# Patient Record
Sex: Female | Born: 1955 | Race: White | Hispanic: No | Smoking: Current every day smoker
Health system: Southern US, Community
[De-identification: ages and names within clinical notes are randomized; demographics above are authoritative.]

## PROBLEM LIST (undated history)

## (undated) DIAGNOSIS — Z860100 Personal history of colon polyps, unspecified: Secondary | ICD-10-CM

## (undated) DIAGNOSIS — J45909 Unspecified asthma, uncomplicated: Secondary | ICD-10-CM

## (undated) DIAGNOSIS — M1990 Unspecified osteoarthritis, unspecified site: Secondary | ICD-10-CM

## (undated) DIAGNOSIS — R928 Other abnormal and inconclusive findings on diagnostic imaging of breast: Secondary | ICD-10-CM

## (undated) DIAGNOSIS — H269 Unspecified cataract: Secondary | ICD-10-CM

## (undated) DIAGNOSIS — F32A Depression, unspecified: Secondary | ICD-10-CM

## (undated) DIAGNOSIS — F1020 Alcohol dependence, uncomplicated: Secondary | ICD-10-CM

## (undated) DIAGNOSIS — K759 Inflammatory liver disease, unspecified: Secondary | ICD-10-CM

## (undated) DIAGNOSIS — F329 Major depressive disorder, single episode, unspecified: Secondary | ICD-10-CM

## (undated) DIAGNOSIS — T7840XA Allergy, unspecified, initial encounter: Secondary | ICD-10-CM

## (undated) DIAGNOSIS — N63 Unspecified lump in unspecified breast: Secondary | ICD-10-CM

## (undated) DIAGNOSIS — F419 Anxiety disorder, unspecified: Secondary | ICD-10-CM

## (undated) DIAGNOSIS — F319 Bipolar disorder, unspecified: Secondary | ICD-10-CM

## (undated) DIAGNOSIS — E785 Hyperlipidemia, unspecified: Secondary | ICD-10-CM

## (undated) HISTORY — DX: Unspecified cataract: H26.9

## (undated) HISTORY — DX: Alcohol dependence, uncomplicated: F10.20

## (undated) HISTORY — DX: Personal history of colonic polyps: Z86.010

## (undated) HISTORY — DX: Allergy, unspecified, initial encounter: T78.40XA

## (undated) HISTORY — PX: PILONIDAL CYST EXCISION: SHX744

## (undated) HISTORY — PX: TUBAL LIGATION: SHX77

## (undated) HISTORY — PX: TONSILLECTOMY: SUR1361

## (undated) HISTORY — PX: JOINT REPLACEMENT: SHX530

## (undated) HISTORY — PX: HIP FRACTURE SURGERY: SHX118

## (undated) HISTORY — PX: BACK SURGERY: SHX140

## (undated) HISTORY — DX: Personal history of colon polyps, unspecified: Z86.0100

## (undated) HISTORY — PX: CHOLECYSTECTOMY: SHX55

---

## 1998-02-21 ENCOUNTER — Ambulatory Visit (HOSPITAL_COMMUNITY): Admission: RE | Admit: 1998-02-21 | Discharge: 1998-02-21 | Payer: Self-pay | Admitting: Family Medicine

## 1998-02-21 ENCOUNTER — Encounter: Payer: Self-pay | Admitting: Family Medicine

## 1998-03-09 ENCOUNTER — Encounter: Admission: RE | Admit: 1998-03-09 | Discharge: 1998-06-07 | Payer: Self-pay | Admitting: Neurosurgery

## 1999-10-28 ENCOUNTER — Emergency Department (HOSPITAL_COMMUNITY): Admission: EM | Admit: 1999-10-28 | Discharge: 1999-10-28 | Payer: Self-pay | Admitting: Emergency Medicine

## 1999-11-23 ENCOUNTER — Ambulatory Visit (HOSPITAL_COMMUNITY): Admission: RE | Admit: 1999-11-23 | Discharge: 1999-11-23 | Payer: Self-pay | Admitting: Neurosurgery

## 2000-01-01 ENCOUNTER — Ambulatory Visit (HOSPITAL_COMMUNITY): Admission: RE | Admit: 2000-01-01 | Discharge: 2000-01-01 | Payer: Self-pay | Admitting: Neurosurgery

## 2000-01-22 ENCOUNTER — Ambulatory Visit (HOSPITAL_COMMUNITY): Admission: RE | Admit: 2000-01-22 | Discharge: 2000-01-22 | Payer: Self-pay | Admitting: Neurosurgery

## 2000-02-05 ENCOUNTER — Ambulatory Visit (HOSPITAL_COMMUNITY): Admission: RE | Admit: 2000-02-05 | Discharge: 2000-02-05 | Payer: Self-pay | Admitting: Neurosurgery

## 2001-04-07 ENCOUNTER — Encounter: Payer: Self-pay | Admitting: Family Medicine

## 2001-04-07 ENCOUNTER — Ambulatory Visit: Admission: RE | Admit: 2001-04-07 | Discharge: 2001-04-07 | Payer: Self-pay | Admitting: Family Medicine

## 2001-04-16 ENCOUNTER — Ambulatory Visit (HOSPITAL_COMMUNITY): Admission: RE | Admit: 2001-04-16 | Discharge: 2001-04-16 | Payer: Self-pay | Admitting: Gastroenterology

## 2001-06-15 ENCOUNTER — Encounter: Admission: RE | Admit: 2001-06-15 | Discharge: 2001-06-15 | Payer: Self-pay | Admitting: Neurosurgery

## 2001-07-05 ENCOUNTER — Inpatient Hospital Stay (HOSPITAL_COMMUNITY): Admission: RE | Admit: 2001-07-05 | Discharge: 2001-07-11 | Payer: Self-pay | Admitting: Neurosurgery

## 2002-01-19 ENCOUNTER — Other Ambulatory Visit: Admission: RE | Admit: 2002-01-19 | Discharge: 2002-01-19 | Payer: Self-pay | Admitting: Family Medicine

## 2002-01-26 ENCOUNTER — Encounter: Payer: Self-pay | Admitting: Family Medicine

## 2002-01-26 ENCOUNTER — Ambulatory Visit (HOSPITAL_COMMUNITY): Admission: RE | Admit: 2002-01-26 | Discharge: 2002-01-26 | Payer: Self-pay | Admitting: Family Medicine

## 2002-02-07 ENCOUNTER — Encounter: Admission: RE | Admit: 2002-02-07 | Discharge: 2002-03-07 | Payer: Self-pay | Admitting: Neurosurgery

## 2002-02-09 ENCOUNTER — Ambulatory Visit (HOSPITAL_COMMUNITY): Admission: RE | Admit: 2002-02-09 | Discharge: 2002-02-09 | Payer: Self-pay | Admitting: Neurosurgery

## 2002-03-24 ENCOUNTER — Encounter: Admission: RE | Admit: 2002-03-24 | Discharge: 2002-06-22 | Payer: Self-pay

## 2002-08-11 ENCOUNTER — Encounter
Admission: RE | Admit: 2002-08-11 | Discharge: 2002-11-09 | Payer: Self-pay | Admitting: Physical Medicine & Rehabilitation

## 2002-11-18 ENCOUNTER — Encounter: Admission: RE | Admit: 2002-11-18 | Discharge: 2002-11-18 | Payer: Self-pay | Admitting: Orthopedic Surgery

## 2002-11-18 ENCOUNTER — Encounter: Payer: Self-pay | Admitting: Orthopedic Surgery

## 2003-02-02 ENCOUNTER — Ambulatory Visit (HOSPITAL_COMMUNITY): Admission: RE | Admit: 2003-02-02 | Discharge: 2003-02-02 | Payer: Self-pay | Admitting: Obstetrics and Gynecology

## 2003-03-03 ENCOUNTER — Encounter: Admission: RE | Admit: 2003-03-03 | Discharge: 2003-03-03 | Payer: Self-pay | Admitting: Orthopedic Surgery

## 2003-07-17 ENCOUNTER — Inpatient Hospital Stay (HOSPITAL_COMMUNITY): Admission: RE | Admit: 2003-07-17 | Discharge: 2003-07-20 | Payer: Self-pay | Admitting: Orthopedic Surgery

## 2003-09-18 ENCOUNTER — Encounter: Admission: RE | Admit: 2003-09-18 | Discharge: 2003-12-17 | Payer: Self-pay | Admitting: Orthopedic Surgery

## 2003-10-14 ENCOUNTER — Emergency Department (HOSPITAL_COMMUNITY): Admission: EM | Admit: 2003-10-14 | Discharge: 2003-10-14 | Payer: Self-pay | Admitting: Emergency Medicine

## 2004-01-03 ENCOUNTER — Ambulatory Visit (HOSPITAL_COMMUNITY): Admission: RE | Admit: 2004-01-03 | Discharge: 2004-01-03 | Payer: Self-pay | Admitting: Neurosurgery

## 2004-02-27 ENCOUNTER — Ambulatory Visit (HOSPITAL_COMMUNITY): Admission: RE | Admit: 2004-02-27 | Discharge: 2004-02-27 | Payer: Self-pay | Admitting: Gastroenterology

## 2006-06-24 ENCOUNTER — Encounter: Admission: RE | Admit: 2006-06-24 | Discharge: 2006-06-24 | Payer: Self-pay | Admitting: Neurosurgery

## 2007-09-21 ENCOUNTER — Ambulatory Visit (HOSPITAL_COMMUNITY): Admission: RE | Admit: 2007-09-21 | Discharge: 2007-09-21 | Payer: Self-pay | Admitting: Obstetrics and Gynecology

## 2007-10-06 ENCOUNTER — Other Ambulatory Visit: Admission: RE | Admit: 2007-10-06 | Discharge: 2007-10-06 | Payer: Self-pay | Admitting: Obstetrics and Gynecology

## 2010-03-17 ENCOUNTER — Encounter: Payer: Self-pay | Admitting: Obstetrics and Gynecology

## 2010-03-17 ENCOUNTER — Encounter: Payer: Self-pay | Admitting: Family Medicine

## 2010-03-25 ENCOUNTER — Other Ambulatory Visit: Payer: Self-pay | Admitting: Obstetrics and Gynecology

## 2010-03-25 DIAGNOSIS — Z139 Encounter for screening, unspecified: Secondary | ICD-10-CM

## 2010-04-01 ENCOUNTER — Ambulatory Visit (HOSPITAL_COMMUNITY): Payer: Medicare Other

## 2010-07-12 NOTE — Discharge Summary (Signed)
[name], [name]                          ACCOUNT NO.:  [id number]   MEDICAL RECORD NO.:  [id number]                   PATIENT TYPE:  INP   LOCATION:  5035                                 FACILITY:  [facility]   PHYSICIAN:  Mila Homer. Sherlean Foot, M.D.              DATE OF BIRTH:  09/19/1955   DATE OF ADMISSION:  07/17/2003  DATE OF DISCHARGE:  07/20/2003                                 DISCHARGE SUMMARY   ADMITTING DIAGNOSES:  1. Avascular necrosis, right hip.  2. Seasonal allergies.  3. Low back pain with a history of fusion at multiple levels.   DISCHARGE DIAGNOSES:  1. Status post right total hip arthroplasty.  2. Acute blood loss anemia secondary to surgery.  3. Leukocytosis, resolved.  4. Seasonal allergies.  5. Lumbar fusion, chronic lumbar pain.   HISTORY OF PRESENT ILLNESS:  [name] is a 55 year old white female with a 2-  to 3-year history of right hip pain.  Pain described as a constant, dull,  aching pain with no radiation.  Mechanical symptoms positive for giving way  of the leg at times.  The patient has difficulty falling asleep and wakes up  during the night due to the right hip pain.  She gets pain relief with  Demerol and Darvocet.  She has tried cortisone injections in the hip which  give her pain relief for approximately 3-4 months.  However, her last hip  injection gave her relief from the pain for only 5 days.  The patient's pain  is worse with standing, walking, getting up from a sitting position.  Lying  down on it does help her pain.  The patient uses a walker at times to  ambulate.  X-rays of the right hip shows avascular necrosis.  AP pelvis was  performed in the office shows AVN of right hip.  MRI of the pelvis shows AVN  of both hips.  The patient therefore was admitted to [facility] on  Jul 17, 2003 to undergo a right total hip replacement.   ALLERGIES:  1. CODEINE causes nausea/vomiting.  2. VICODIN causes nausea/vomiting.   MEDICATIONS:   Valium b.i.d. to t.i.d. daily p.r.n., Demerol b.i.d. to t.i.d.  daily p.r.n.,  Darvocet b.i.d. to t.i.d. daily p.r.n. - dosage question, and  Allegra D b.i.d.   SURGICAL PROCEDURE:  The patient was taken to the operating room on Jul 17, 2003 by Dr. Georgena Spurling, assisted by Arnoldo Morale, P.A.-C.  The patient was  placed under general anesthesia and the right total hip replacement was  performed.  The following components were used:  Size 11 fully porous coated  stem, 32 mm + 35 head onto a mortise taper.  The patient tolerated the  procedure well and returned to recovery in good, stable condition.   CONSULTS:  The following consults were obtained:  PT, OT, rehab, case  management.   HOSPITAL COURSE:  Postoperatively the patient developed leukocytosis.  However, this was resolved at the time of discharge and the patient was  afebrile.  The patient also had developed acute blood loss anemia secondary  to surgery.  However, remained asymptomatic and did not require any blood  transfusions.  The patient was discharged home on postoperative day #3 in a  good, stable condition.  Tmax at the time was 100.3.  Vital signs otherwise  were stable.   CBC on admission all values were within normal limits.  At the time of  discharge hemoglobin 9.6, hematocrit 27.3.  Coags on admission all within  normal limits on admission.  Routine chemistries on admission all within  normal limits except for glucose which was slightly elevated at 101.  Hepatic enzymes on admission were all within normal limits.  UA on admission  was negative.   X-rays:  AP pelvis performed on Jul 17, 2003 postoperatively showed good AP  position of the right total hip.   DISCHARGE INSTRUCTIONS:  1. The patient was to continue home medications except for Demerol and     Darvocet.  2. Add the following medications:     a. Lovenox 40 mg one injection daily at 8 a.m. for 11 days.     b. Robaxin 500 mg one tablet q.6-8h. as  needed for muscle spasm.     c. Percocet 5 mg one to two tablets q.4-6h. as needed for pain.  3. Activity:  The patient is to be weightbearing as tolerated with walker.     Home health PT with [facility].  4. Wound care:  The patient is to keep wound clean and dry.  May shower     after 2 days with no drainage.  5. The patient is to call office with temperature greater than 101.5,     chills, swelling, foul-smelling drainage, or pain not controlled.  6. Follow-up:  The patient needs follow-up with Dr. Sherlean Foot in the office     approximately 11 days from discharge.  The patient is to call office at     [phone] to make an appointment.   CONDITION ON DISCHARGE:  The patient was discharged home in good stable  condition.      Richardean Canal, P.A.                       Mila Homer. Sherlean Foot, M.D.    GC/MEDQ  D:  08/18/2003  T:  08/19/2003  Job:  [id number]

## 2010-07-12 NOTE — Consult Note (Signed)
NAME:  [name], [name]                          ACCOUNT NO.:  [id number]   MEDICAL RECORD NO.:  [id number]                   PATIENT TYPE:  REC   LOCATION:  TPC                                  FACILITY:  [facility]   PHYSICIAN:  Zachary George, DO                      DATE OF BIRTH:  03/12/1955   DATE OF CONSULTATION:  03/25/2002  DATE OF DISCHARGE:                                   CONSULTATION   REFERRING PHYSICIAN:  Mila Homer. Sherlean Foot, M.D.   Dear Dr. Sherlean Foot,  Thank you very much for kindly referring [name].  Ms. Cadogan was evaluated in  our clinic.  Please refer to the following for details regarding history,  physical, examination, and treatment plan.  Once again, thank you for  allowing [place] to participate in the care of [name].   CHIEF COMPLAINT:  Bilateral hip pain.   HISTORY OF PRESENT ILLNESS:  [name] is a pleasant 55 year old right-hand  dominant female who has been diagnosed with avascular necrosis of the hips  bilaterally.  She is a kind referral from Dr. Sherlean Foot for pain management.  The patient states that she has had a long history of low back pain for  approximately 20 years with disk problems and pain radiating into her left  lower extremity.  She also has had a 10 year history of bilateral hip pain,  right greater then left.  She had thought that the pain was mainly coming  from her back, and underwent lumbar surgery on 07/05/01, per Dr. Tressie Stalker, which she states helped her low back pain and lower extremity  radicular symptoms significantly, but did not help her hip pain.  Further  workup included MRI of her hips bilaterally which revealed avascular  necrosis bilaterally.  She has had some steroid injections into her back  previously, and has had oral doses of steroids for poison ivy throughout her  life, but she has not been on chronic steroids for any length of time.  She  does state that  her mother had avascular necrosis in her hips as well.  She  was referred by Dr. Lovell Sheehan to Dr. Sherlean Foot who recommended pain management  with eventual total hip arthroplasty.  She rates her pain today as a 5/10 on  a subjective scale, and describes it as moderate.  She describes her pain  as constant, dull, achy, with associated feeling of weakness in her lower  extremities.  Symptoms are worse with walking, bending, sitting, standing,  working, and improved with rest, and some degree with medications which  currently include Darvocet-N 100  q.6-8h. with minimal improvement.  She has  tried various medications, including Celebrex, Vioxx, Bextra, which causes  her to break-out, and did not really help her pain.  She has taken Ultram  in  the past which offered no significant relief.  She has also tried  hydrocodone in the past with associated nausea.  She does not believe she  has taken oxycodone in the past.  Her function and quality of life indices  have declined, and she notes limited activities secondary to hip pain.  She  is unable to walk long distances.  Her sleep is fair.  I reviewed health and  history form and 14 point review of systems.  She admits to unexplained  fatigue, sinus troubles, nose bleeds, ankle swelling on occasion, frequent  urination, arthritis, back pain, and feeling of depression secondary to her  pain complaints.   PAST MEDICAL HISTORY:  1. Depression.  2. Chronic low back pain.  3. Neck pain for which she is currently being followed by Dr. Lovell Sheehan.   PAST SURGICAL HISTORY:  1. Tonsillectomy.  2. Tubal ligation.  3. Pilonidal cyst.  4. Lumbar surgery, including L4-5 fusion.   FAMILY HISTORY:  Diabetes.   SOCIAL HISTORY:  The patient smokes 1/2 pack of cigarettes per day, and I  counseled her on the importance of smoking cessation in terms of pain and  overall health, and she states that she is trying to quit.  Alcohol is  occasional.  She denies drug use  or history of alcohol or drug abuse.  She  is divorced, and is not currently working, as she is applying for  disability.  She is trained as a Sports administrator.   ALLERGIES:  1. CODEINE.  2. NON-STEROIDAL ANTI-INFLAMMATORY MEDICATIONS.   MEDICATIONS:  1. Dar q.6-8h. p.r.n.  2. Valium 5 mg one or two daily p.r.n.  3. Allegra.  4. Effexor.   PHYSICAL EXAMINATION:  GENERAL:  A healthy female in no acute distress.  The  patient is alert and oriented.  Mood and affect are appropriate.  VITAL SIGNS:  Blood pressure is 144/75, pulse is 80, respirations 20, O2  saturation 97% on room air.  HEENT:  Unremarkable.  HEART:  Regular rate and rhythm without murmurs, rubs, or gallops.  LUNGS:  Clear to auscultation bilaterally.  ABDOMEN:  Soft, nontender, diminished bowel sounds throughout.  BACK:  Spine examination reveals decreased lumbar lordosis with level  pelvis, no scoliosis, and a healed vertical midline incisional scar.  There  is normal thoracic kyphosis and cervical lordosis.  Range of motion of the  cervical spine is full.  Range of motion of the lumbar spine is decreased in  all planes with mild discomfort on flexion.  Manual muscle testing is 5/5  bilateral upper and lower extremities on all muscle groups tested.  Sensory  examination is intact to light touch bilateral upper and lower extremities  with the exception of a small area in the left lateral thigh which is  decreased to light touch.  Muscle stretch reflexes are 2+/4 bilateral  biceps, triceps, brachial radialis, pronator teres, patellae, medial  hamstrings, and Achilles.  Spurling maneuver is negative bilaterally.  Straight leg raise is negative bilaterally.  Pearlean Brownie test is positive,  reproducing groin pain bilaterally.  Range of motion of the hips is  decreased bilaterally with pain, especially with internal rotation bilaterally, right greater then left.  There is no heat, erythema, or edema  in the lower extremities at  this time.  No abnormal tone noted in the lower  extremities.   IMPRESSION:  1. Avascular necrosis of bilateral hips with chronic hip pain.  2. Degenerative disk disease of the lumbar spine, status  post L4-5 fusion.  3. 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  No     clinical evidence of cervical radiculopathy at this time.   PLAN:  1. I discussed treatment options with Ms. Werst, to include medication     management.  We discussed non-narcotic and narcotic based pain     medications to decrease her pain and improve her functional abilities.     In general, Ms. Vallo would like to get off of some of her medications,     and we discussed this at length.  Initially, I would like to give her a     trial of Ultracet one or two p.o. t.i.d. p.r.n. for pain, and I have     provided her with 20 sample pills as well as a prescription for 100     pills.  If this is not helpful for her, would consider starting a low-     dose opiate pain medication such as oxycodone, as the patient has not     tolerated hydrocodone previously.  If she does not tolerate the     oxycodone, would consider a trial of methadone or even starting a     sustained release preparation such as MS Contin or Duragesic.  2. I would like her to get involved in an aquatic therapy program, and she     has been considering joining the [facility], and I encouraged her to do so.     The medications and therapy combined hopefully will allow her to become     more functional.  3. The patient is to return in two weeks for re-evaluation.   The patient was educated on the above findings and recommendations and  understands.  There were no barriers to communication.                                                Zachary George, DO    JW/MEDQ  D:  03/25/2002  T:  03/25/2002  Job:  [id number]   cc:   Mila Homer. Sherlean Foot, M.D.  [address] [place]  [place] [address]  Fax: [phone]   Cristi Loron, M.D.  [address]  [place]  [place] [address]  Fax: [phone]

## 2010-07-12 NOTE — Op Note (Signed)
[name], [name]                          ACCOUNT NO.:  [id number]   MEDICAL RECORD NO.:  [id number]                   PATIENT TYPE:  INP   LOCATION:  5035                                 FACILITY:  [facility]   PHYSICIAN:  Mila Homer. Sherlean Foot, M.D.              DATE OF BIRTH:  1955-10-03   DATE OF PROCEDURE:  07/17/2003  DATE OF DISCHARGE:                                 OPERATIVE REPORT   SURGEON:  Mila Homer. Sherlean Foot, M.D.   ASSISTANT:  Legrand Pitts. Duffy, P.A.   ANESTHESIA:  General.   PREOPERATIVE DIAGNOSIS:  Right hip osteoarthritis and avascular necrosis.   POSTOPERATIVE DIAGNOSIS:  Right hip osteoarthritis and avascular necrosis.   PROCEDURE:  Right total hip arthroplasty.   INDICATION FOR PROCEDURE:  The patient is a 55 year old white female with  failure of conservative measures for AVN of the hip.  Informed consent was  obtained.   DESCRIPTION OF PROCEDURE:  The patient was laid supine and administered  general anesthesia and Foley catheter placement.  I then placed her in the  left hip down/right hip up lateral decubitus position with bony prominences  well padded.  I then prepped and draped the right hip in usual sterile  fashion.  I then made a minimally invasive incision 10 cm in length.  I went  down through the skin and subcutaneous tissue to the fascia lata.  I  obtained hemostasis with the cautery.  I then incised the fascia lata along  the length of the incision and placed a Charnley retractor in place.  I then  removed the anterior 1/3 of the gluteus minimis and elevated that off the  hip capsule and tagged it with three stay sutures.  I then did an anterior  hip capsulectomy.  I then externally rotated the leg in extension and used a  cutting guide to mark out my femoral neck cut.  I then made a similar neck  cut with a reciprocating saw and removed that with a Steinmann pin.  I then  placed a Homan retractor anterior and posterior to the acetabulum and  removed  the labrum circumferentially.  I then switched sides of the table  with my PA and reamed progressively from 44 mm to 50 mm with the reamers.  I  then tamped in a no hole, no spike, 52-mm fiber mesh cup.  This was very,  very stable.  I then placed in a provisional 7-mm offset 0 trial liner  accepting a 32-mm head.  I then switched sides of the table back to the back  side of the patient and flexed the leg off into a sterile pouch.  I then  used a canal finder to find the intramedullary canal through the cut surface  of the femoral neck.  I used a side biting reamer to ream into the  trochanter.  I then reamed progressively up to a size 11  reamer.  I then  broached with a size 10 and then a size 11 reamer and had good rotational  stability.  I then trialed with a size 0 and +35 32-mm heads.  The 32-mm +35  head gave the best stability and leg length.  It was very, very stable.  I  then dislocated the hip and exposed the acetabulum.  After removing the  femoral component, I tamped in the real 7-mm offset 0 acetabular lining  accepting a 32-mm head.  I then irrigated and tamped down a size 11 fully  porous-coated stem and then tamped on a  +35, 32-mm head onto a _________ Lattie Corns taper and located the hip.  I took  the hip through an extreme range of motion.  It was quite stable.  I then  irrigated and closed the abductor sleeve down through drill holes into the  trochanter.  I then over-sewed this with figure-of-eight #2 Ethibond  sutures.  I then irrigated again and closed the fascia lata with interrupted  #1 figure-of-eight Vicryl sutures, the deep soft tissues with interrupted 0  Vicryl sutures, and then a subcuticular 2-0 Vicryl stitch and skin staples.  I dressed with Adaptic, 4 x 4s, ABD, and sterile Ioban drape.   COMPLICATIONS:  None.   DRAINS:  None.   ESTIMATED BLOOD LOSS:  300 cubic centimeters.                                               Mila Homer. Sherlean Foot, M.D.     SDL/MEDQ  D:  07/18/2003  T:  07/19/2003  Job:  [id number]

## 2010-07-12 NOTE — Discharge Summary (Signed)
[facility]  Patient:    [name], [name] Visit Number: [id number] MRN: [id number]          Service Type: SUR Location: [id number] Attending Physician:  Cristi Loron Dictated by:   Stefani Dama, M.D. 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  DISCHARGE DIAGNOSES: 1. Lumbar spondylosis, herniated nucleus pulposus with degenerative    spondylolisthesis. 2. Postoperative pulmonary infiltrates and fever.  CONDITION ON DISCHARGE:  Improved.  HOSPITAL COURSE:  The patient is a 55 year old individual who has had significant problems with back pain for a prolonged period of time.  Having failed extensive efforts at conservative therapy, she was admitted on 12th where she underwent a lumbar laminectomy, diskectomy, posterior interbody arthrodesis at the L4-5 and the L5-S1 levels.  She tolerated the surgery well, however, during the postoperative period, for the first four days, she had significant fever to 102 degrees; it was felt to be secondary to pulmonary toilet.  A chest x-ray demonstrated that she had bibasilar infiltrates.  The patient was mobilized.  She was given incentive spirometry and gradually her fevers resolved; chest x-ray changes, however, persisted.  She has, however, at this point had 48 hours of no fevers; she has, however, developed 3+ pitting edema of both ankles.  At this time it was felt that she has some fluid overload and she is sent home with an additional diuretic of hydrochlorothiazide 25 mg a day for the next three days time.  Her incision is clean and dry.  Her pain is well-controlled with oral Demerol and she is also given a prescription for Valium for home pain control.  FOLLOWUP:  She will be seen by Dr. Cristi Loron in the next week to  plan for further followup. Dictated by:   Stefani Dama, M.D. Attending Physician:  Tressie Stalker D DD:  07/11/01 TD:  07/13/01 Job: [id number]

## 2010-07-12 NOTE — Op Note (Signed)
[place]  Patient:    [name], TOWE Visit Number: [id number] MRN: [id number]          Service Type: SUR Location: [facility] [id number] 06 Attending Physician:  Cristi Loron Dictated by:   Cristi Loron, M.D. Proc. Date: 07/05/01 Admit Date:  07/05/2001                             Operative Report  INDICATIONS:  The patient is a 55 year old white female who has suffered from back and leg pain.  She failed extensive medical management, including time, medications, physical therapy, injections, etc.  I therefore sent her for a lumbar discogram.  It demonstrated concordant pain at L4-5 and L5-S1.  I therefore discussed a lumbar fusion with the patient.  She weighed the risks, benefits, and alternatives of surgery, and decided to proceed with the operation.  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  OPERATION PERFORMED 1. L4-5 and L5-S1 posterior lumbar interbody fusion. 2. Placement of bilateral tangent 10 mm x 26 mm bone dowels at L4-5 and    L5-S1. 3. Posterior segmental instrumentation at L4-5 and L5-S1, with CD Horizon    MM10 titanium pedicle screws and rods. 4. Posterior arthrodesis with Vitoss bone scaffolding and local    morcellized autograft bone.  SURGEON:  Cristi Loron, M.D.  ASSISTANT:  Hewitt Shorts, M.D.  ANESTHESIA:  General endotracheal.  ESTIMATED BLOOD LOSS:  400 cc.  SPECIMENS:  None.  DRAIN:  None.  COMPLICATIONS:  None.  DESCRIPTION OF PROCEDURE:  The patient was brought to the operating room by the anesthesia team.  General endotracheal anesthesia was induced.  The patient was then turned to the prone position on the chest rolls.  The lumbosacral region was then prepared with Betadine scrub and Betadine solution.  Sterile  drapes were applied.  I then injected the _________ size with Marcaine with epinephrine solution, and used the scalpel to make a linear midline incision over the L4-L5 and L5-S1 interspace.  I used electrocautery to dissect down to the thoracolumbar fascia, and then divide the fascia bilaterally, performing a bilateral subperiosteal dissection, stripping the paraspinous muscles from their bilateral spinous process and lamina of L4, L5, and the upper sacrum.  I inserted a McCullough retractor for exposure.  Then I obtained intraoperative radiographs, to confirm the location.  I then used the high-speed drill to perform bilateral L4 and L5 laminotomies.  I widened the laminotomies with the Kerrison punch and removed the bilateral L4-5 and L5-S1 ligament of flavum.  I then freed up the thecal sac and the bilateral L5 and S1 nerve roots and epidural tissues.  Then I performed an aggressive bilateral diskectomy at L4-5 and L5-S1 using the pituitary forceps, Epstein and ________ curets.  I prepared the vertebral bodies at L4-5 and L5-S1 for the fusion, using the cutting curets and the chisel, preparing the intervertebral end plates bilaterally.  I then carefully distracted the interspace with the 10 mm spreader, and then placed a 10 mm x 26 mm tented bone dowel bilaterally at L4-5 and L5-S1.  I packed in between the bone dowels with Vitoss bone substitutes and local morcellized autograft bone, completing the posterior lumbar interbody fusion.  I now turned my attention to the posterior segmental instrumentation.  I used electrocautery to  expose the bilateral transverse process at L4-L5.  I then under fluoroscopic guidance decorticated posterior to the bilateral L4, L5, and the S1 pedicles.  I cannulated the pedicles with the pedicle probe.  I tapped the pedicles with the 5.5 mm tap.  I inserted 6.5 mm x 45.0 mm pedicle screws bilaterally at L4, L5, and 6.5 mm x 40 mm pedicle screws bilaterally  at S1, again under fluoroscopic guidance.  Prior to placing the screws, I cannulated the tapped pedicle and noted that the cortex would not breech, and after placing the pedicles, I felt along the medial aspect of the pedicle, and made sure that it was not compressing either L4, L5, or S1 nerve root.  I then connected the unilateral pedicles with a lordotic 70 mm titanium rod and then secured it in place with the appropriate cap on each pedicle screw.  I then incised the L5-S1 and L4-5 interspinous ligaments and ruled the L5 spinous process in order to place the cross-connector.  I used the appropriate length cross-connector to connect the two rods and then secured the cross-connector using the appropriate tighteners/bolts.  I now terminated the instrumentation and did a posterior lateral arthodesis. I used the high-speed drill to decorticate the remainder of the L4-5 and L5-S1 facet joint in the pars region, as well as the transverse process bilaterally at L4 and L5.  I then laid a combination of local morcellized autograft and Vitoss bone scaffolding over the decorticated posterior lateral structures, completing a posterolateral arthrodesis.  I then inspected the thecal sac bilaterally at L4-5 and L5-S1, as well as the exit route of the L5 and S1 nerve roots bilaterally, and noted that the neural structures were well decompressed.  I achieved strenuous hemostasis using bipolar electrocautery. I irrigated the wound out with bacitracin solution, and removed the solution, and then removed the McCullough retractor, and then reapproximated the patients thoracolumbar fascia with interrupted #1 Vicryl suture, the subcutaneous tissue with interrupted #2-0 Vicryl suture, and the skin with Steri-Strips with Benzoin.  The wound was then coated with bacitracin ointment and sterile dressings were applied.  The drapes were removed.  The patient was returned to the supine position, where she was  extubated by the anesthesia team, and transported to the postanesthesia care unit in stable condition.  All sponge, instrument, and needle counts were correct at the end  of this case. Dictated by:   Cristi Loron, M.D. Attending Physician:  Tressie Stalker D DD:  07/05/01 TD:  07/05/01 Job: [id number]

## 2010-07-12 NOTE — H&P (Signed)
[name] Peters, [name] Peters                          ACCOUNT NO.:  [id number]   MEDICAL RECORD NO.:  [id number]                   PATIENT TYPE:  INP   LOCATION:                                       FACILITY:  [facility]   PHYSICIAN:  [name] Peters. [name] Peters, M.D.              DATE OF BIRTH:  1956/01/24   DATE OF ADMISSION:  07/17/2003  DATE OF DISCHARGE:                                HISTORY & PHYSICAL   CHIEF COMPLAINT:  Right hip pain.   HISTORY OF PRESENT ILLNESS:  [name] Peters is 55 year old white female with a two  to three-year history of right hip pain.  She describes the pain as a  constant dull aching pain with no radiation.  Mechanical symptoms are the  cause of her leg giving way at times.  She has difficulty falling asleep at  night and wakes up due to the right hip pain.  She takes Demerol and  Darvocet for pain with some relief.  She tried cortisone injections into the  right hip which has given her three to four months of pain relief at most.  According to Dr. Tobin Peters note, the patient had five days of pain-free after  she received her last right hip injection.  The patient's pain is worse with  standing, walking, and getting up from a sitting position with steps.  Laying down helps her pain.  She uses a walker at times to ambulate.  Her  right hip shows avascular necrosis.  AP pelvis performed in the office shows  AVN of the right hip.  MRI of the pelvis shows AVN of both hips.  The  patient informed by Dr. Sherlean Peters at the last visit on Jul 04, 2003, about  options.  He has decided that we will proceed with a total joint replacement  of the right hip.  The patient is to be admitted to [facility] on  Jul 17, 2003, and undergo a right total arthroplasty.   ALLERGIES:  1. CODEINE causes nausea and vomiting.  2. VICODIN causes nausea and vomiting.   MEDICATIONS:  1. Valium b.i.d. to t.i.d. daily p.r.n.  Question dosage.  2. Demerol b.i.d. to t.i.d. daily p.r.n.  Question dosage.  3. Darvocet b.i.d. to t.i.d. daily p.r.n.  Question dosage.  4. Allegra D b.i.d.   PAST MEDICAL HISTORY:  Positive for:  1. Seasonal allergies.  2. Lumbar fusion.  3. Chronic lumbar pain.   The patient denies any diabetes mellitus, coronary artery disease,  hypertension, thyroid disease, or anemia.   PAST SURGICAL HISTORY:  1. Tonsillectomy.  2. Tubal ligation in 1998.  3. Pilonidal cyst in 1997.  4. Back surgery and fusion in May of 2003.   The patient denies any complications with the anesthesia with any of the  above procedures.  She has not required any blood transfusions in the past.  She did have a fever of  unknown origin with her back surgery in May of 2003,  resolved.   SOCIAL HISTORY:  The patient recently quit smoking approximately one month  ago.  She denies any alcohol use.  She is divorced.  She has one female child  who lives with her.  Her primary care physician is [name] Peters, M.D., in  [place], [place].  The patient lives in a one-story home with five  steps to the usual entrance.  The patient is temporarily disabled.   FAMILY HISTORY:  The patient's mother is alive and has known irregular  heartbeat, question atrial fibrillation, on Coumadin.  She has peripheral  edema and urinary incontinence.  The patient's father is deceased of  questionable MI.  He had known diabetes mellitus and colon cancer.  The  patient's brother is alive and has a history of colon cancer.  She has two  sisters, both older, with no known health problems.   REVIEW OF SYSTEMS:  The patient denies any recent cold, cough, fever, or flu-  like symptoms.  She denies any recent cold, cough, fever, or flu-like  symptoms.  She denies any chest pain, shortness of breath, orthopnea, or  PND.  She has neck pain and lower lumbar pain with some nerve damage that  causes pain down her left leg.  She wears glasses.  She denies any GI or GU  medical problems.  She denies any anemia.  She  denies any hematologic or  hepatic disease.  The patient does not have a living will nor does she have  a power-of-attorney.   PHYSICAL EXAMINATION:  GENERAL APPEARANCE:  The patient is a well-developed,  well-nourished, overweight female.  She walks with a slow antalgic gait with  a slight limp on the right.  The patient's mood and affect are appropriate.  She talks easily with the examiner.  HEIGHT:  5 feet 6-1/2 inches.  WEIGHT:  Approximately 180 pounds.  VITAL SIGNS:  Temperature 97.6 degrees, blood pressure 110/64, pulse 70,  respiratory rate 16.  CARDIAC:  Regular rate and rhythm.  No murmurs, rubs, or gallops noted.  LUNGS:  Clear to auscultation bilaterally.  No wheezing, rhonchi, or rales.  ABDOMEN:  Soft, nontender, and obese.  Positive bowel sounds.  HEENT:  Normocephalic and atraumatic without frontal or maxillary sinus  tenderness to palpation.  Sclerae nonicteric bilaterally.  Conjunctivae are  pink.  PERRLA.  EOMI.  There are no visible external ear deformities noted.  TMs are pearly gray bilaterally.  Nasal septum midline.  Nasal mucosa  slightly erythematous, especially in the left naris.  There is no evidence  of polyps.  Buccal mucosa was pink and moist.  Pharynx with slight erythema  and postnasal drip.  No exudate.  The tongue and uvula are midline.  NECK:  Trachea midline.  No lymphadenopathy.  Carotids are 2+ and without  bruits.  There is no tenderness with palpation along the cervical spine.  BACK:  Nontender to palpation over the thoracic spine.  At the lower lumbar  at the level of L4-5, there is slight tenderness.  BREASTS:  Exam deferred at this time.  GENITOURINARY:  Exam deferred at this time.  RECTAL:  Exam deferred at this time.  NEUROLOGIC:  The patient is alert and oriented x 3.  Cranial nerves II-XII  grossly intact.  Deep tendon reflexes in the upper and lower extremities reveal the reflexes to be equal and symmetric throughout.  Strength testing   of the upper and  lower extremities reveal 5/5 strength throughout against  resistance.  MUSCULOSKELETAL:  The patient's upper extremities have full range of motion.  The upper extremities are neurologically, motor, and vascularly intact.  Radial pulses 2+.  The upper extremities are equal and symmetric  bilaterally.  The lower extremities showed left hip with full range of  motion, however, there is some pain with external rotation.  Right hip pain  with flexion and internal and external rotation.  Limited external rotation  to approximately 10 degrees.  Full range of motion of the knees bilaterally.  There is no crepitus.  Mild edema in the lower extremities, nonpitting.  The  dorsal pedal pulses are 2+ bilaterally.   X-RAYS:  AP pelvis and bilateral lateral hips reveal avascular necrosis of  the right hip.  There is no evidence of avascular necrosis in the left hip.   IMPRESSION:  1. Avascular necrosis, right hip.  2. Seasonal allergies.  3. Low back pain with history of fusion at multiple levels.   PLAN:  The patient is to be admitted to [facility] on Jul 17, 2003,  to undergo a right total hip arthroplasty by Dr. Sherlean Peters.  The patient is to  undergo all preoperative laboratories and testing prior to surgery.      Richardean Canal, P.A.                       [name] Peters. [name] Peters, M.D.    GC/MEDQ  D:  07/06/2003  T:  07/06/2003  Job:  [id number]

## 2010-07-12 NOTE — Procedures (Signed)
[facility]  Patient:    [name], [name] Visit Number: [id number] MRN: [id number]          Service Type: END Location: ENDO Attending Physician:  Deneen Harts Dictated by:   Everardo All Madilyn Fireman, M.D. Proc. Date: 04/16/01 Admit Date:  04/16/2001   CC:         Gaynelle Cage, M.D.   Procedure Report  PROCEDURE:  Colonoscopy.  INDICATION FOR PROCEDURE:  Family history of colon cancer in a first degree relative.  DESCRIPTION OF PROCEDURE:  The patient was placed in the left lateral decubitus position and placed on the pulse monitor with continuous low flow oxygen delivered by nasal cannula. She was sedated with 120 mg IV Demerol and 12 mg IV Versed. The Olympus video colonoscope was inserted into the rectum and advanced to the cecum, confirmed by transillumination at McBurneys point and visualization of the ileocecal valve and appendiceal orifice. The prep was generally poor throughout the majority of the colon and I could not rule out lesions less than 2 cm in large areas of the colon otherwise the cecum, ascending, transverse, descending and sigmoid colon all appeared normal with no masses, polyps, diverticula or other mucosal abnormalities. The rectum likewise appeared normal and retroflexed view of the anus revealed no obvious internal hemorrhoids. The colonoscope was then withdrawn and the patient returned to the recovery room in stable condition. The patient tolerated the procedure well and there were no immediate complications.  IMPRESSION:  Normal colonoscopy but with poor prep.  PLAN:  Will repeat colonoscopy in three years with more thorough bowel prep. Dictated by:   Everardo All Madilyn Fireman, M.D. Attending Physician:  Deneen Harts DD:  04/16/01 TD:  04/16/01 Job: [id number]

## 2010-07-12 NOTE — Op Note (Signed)
NAME:  [name], [name]                ACCOUNT NO.:  [id number]   MEDICAL RECORD NO.:  [id number]          PATIENT TYPE:  AMB   LOCATION:  ENDO                         FACILITY:  [facility]   PHYSICIAN:  John C. Madilyn Fireman, M.D.    DATE OF BIRTH:  1955/10/02   DATE OF PROCEDURE:  02/27/2004  DATE OF DISCHARGE:                                 OPERATIVE REPORT   PROCEDURE:  Colonoscopy.   INDICATION FOR PROCEDURE:  Family history of colon cancer in one first and  possibly a second-degree relative as well.   DESCRIPTION OF PROCEDURE:  The patient was placed in the left lateral  decubitus position and placed on the pulse monitor with continuous low flow  oxygen delivered by nasal cannula.  She was sedated with 100 mcg IV fentanyl  and 10 mg IV Versed and 25 mg IV Phenergan.  The Olympus video colonoscope  was inserted into the rectum and advanced to the cecum, confirmed by  transillumination of McBurney's point and visualization of ileocecal valve  and appendiceal orifice.  The prep was good.  The cecum, ascending,  transverse, descending, and sigmoid colon all appeared normal with no  masses, polyps, diverticula, or other mucosal abnormalities.  The rectum  likewise appeared normal and retroflexed view of the anus revealed no  obvious internal hemorrhoids.  The scope was then withdrawn and the patient  returned to the recovery room in stable condition.  She tolerated the  procedure well and there were no immediate complications.   IMPRESSION:  Normal colonoscopy.   PLAN:  Repeat colonoscopy in 5 years.      JCH/MEDQ  D:  02/27/2004  T:  02/27/2004  Job:  [id number]   cc:   Ernestina Penna, M.D.  [address] [place]  [place] [address]  Fax: [phone]

## 2010-07-12 NOTE — Consult Note (Signed)
[name], [name]                            ACCOUNT NO.:  [id number]   MEDICAL RECORD NO.:  [id number]                   PATIENT TYPE:   LOCATION:                                       FACILITY:   PHYSICIAN:  Zachary George, DO                      DATE OF BIRTH:  12-Feb-1956   DATE OF CONSULTATION:  DATE OF DISCHARGE:                                   CONSULTATION   HISTORY OF PRESENT ILLNESS:  Ms. Warzecha returns to the clinic today for  reevaluation.  She was initially seen on 03/25/02 for chronic bilateral hip  pain secondary to avascular necrosis.  The patient also has low back pain,  status post lumbar surgery.  She also has degenerative disk disease in the  cervical spine which is followed by Cristi Loron, M.D.  Currently, the  patient's pain is a 7/10 on subjective scale and described as constant.  She  did not get any relief with Ultracet which she states gave her some  diarrhea.  She continues Darvocet and Valium but would eventually like to  get off of these medications as she does not want to be on any addictive-  type medicines.  Ms. Fifield and I discussed other medications to try to help  her pain.  She has been on multiple nonsteroidal anti-inflammatory  medications which she was unable to tolerate secondary to side effects and  allergic reaction.  She follows up with Dr. Lovell Sheehan in regards to her neck  in a month or so and anticipates some type of surgical intervention.  I  reviewed Health and History Form and 14-Point Review of Systems.  Ms. Cage  has not had any physical therapy in regards to her neck.  She has had  physical therapy for her lower back and hips.  Today, however, her neck pain  seems to be worse than her back and hip pain.   PHYSICAL EXAMINATION:  GENERAL:  A healthy female in no acute distress.  VITAL SIGNS:  Blood pressure 129/76, pulse 77, respirations 20, O2  saturation 97% on room air.  BACK:  Patient has full range of motion of the cervical  spine with mild  discomfort.  There is minimal tenderness to palpation of the cervical,  paraspinal muscles and periscapular muscles.  NEUROLOGIC:  Neurologic examination is intact in the upper and lower  extremities to motor and reflexes.  Sensory examination is essentially  normal with the exception of decreased light touch in the dorsum of the  right hand and lateral left thigh which has been chronic.  Spurling maneuver  is negative bilaterally.   IMPRESSIONS:  1. 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  No     clinical evidence of cervical radiculopathy at this time, although she     has intermittent radicular symptoms into the right upper extremity.  2. Avascular necrosis of bilateral hips with chronic hip pain.  3. Degenerative disk disease of the lumbar spine, status post L4,5 fusion.   PLAN:  1. Discussed treatment options with Ms. Welke.  In terms of medication     management, she has failed nonsteroidal anti-inflammatory medications,     nonnarcotic pain medications including Ultracet.  She continues on     Darvocet 1, two to three times per day and ultimately would like to wean     herself off of this medication.  She is not interested in pursuing other     narcotic based pain medications at this time.  She would also like to     wean herself off of Valium.  With this in mind, I discussed Lidoderm     patches with her and will prescribe 30 patches to use up to 12 hours per     day, maximum three patches at a time.  I would also like to get her     started in physical therapy for range of motion stretching, cervical     stabilization exercises, trial of intermittent cervical traction and home     exercise program two to three times per week for four weeks.  If symptoms     are not getting any better and Dr. Lovell Sheehan does not feel like she is a     surgical candidate, would recommend a trial of cervical epidural steroid     injections to help with her  pain management.  2. The patient to return to clinic on an as needed basis.   The patient was educated in above findings and recommendations and  understands.  There were no barriers to communication.                                                Zachary George, DO    JW/MEDQ  D:  04/12/2002  T:  04/12/2002  Job:  [id number]   cc:   Mila Homer. Sherlean Foot, M.D.  [address] [place]  [place] [address]  Fax: [phone]   Cristi Loron, M.D.  [address]  [place]  [place] [address]  Fax: [phone]

## 2011-11-18 ENCOUNTER — Other Ambulatory Visit: Payer: Self-pay | Admitting: Obstetrics and Gynecology

## 2011-11-18 DIAGNOSIS — Z139 Encounter for screening, unspecified: Secondary | ICD-10-CM

## 2011-11-24 ENCOUNTER — Ambulatory Visit (HOSPITAL_COMMUNITY)
Admission: RE | Admit: 2011-11-24 | Discharge: 2011-11-24 | Disposition: A | Discharge status: Home / Self Care | Payer: Medicare Other | Source: Ambulatory Visit | Attending: Obstetrics and Gynecology | Admitting: Obstetrics and Gynecology

## 2011-11-24 DIAGNOSIS — Z139 Encounter for screening, unspecified: Secondary | ICD-10-CM

## 2011-11-24 DIAGNOSIS — Z1231 Encounter for screening mammogram for malignant neoplasm of breast: Secondary | ICD-10-CM | POA: Insufficient documentation

## 2011-11-26 ENCOUNTER — Other Ambulatory Visit: Payer: Self-pay | Admitting: Obstetrics and Gynecology

## 2011-11-26 ENCOUNTER — Other Ambulatory Visit (HOSPITAL_COMMUNITY)
Admission: RE | Admit: 2011-11-26 | Discharge: 2011-11-26 | Disposition: A | Discharge status: Home / Self Care | Payer: Medicare Other | Source: Ambulatory Visit | Attending: Obstetrics and Gynecology | Admitting: Obstetrics and Gynecology

## 2011-11-26 DIAGNOSIS — Z124 Encounter for screening for malignant neoplasm of cervix: Secondary | ICD-10-CM | POA: Insufficient documentation

## 2012-07-23 ENCOUNTER — Other Ambulatory Visit: Payer: Self-pay | Admitting: Neurosurgery

## 2012-07-23 DIAGNOSIS — M549 Dorsalgia, unspecified: Secondary | ICD-10-CM

## 2012-08-02 ENCOUNTER — Other Ambulatory Visit: Payer: Medicare Other

## 2012-08-03 ENCOUNTER — Ambulatory Visit
Admission: RE | Admit: 2012-08-03 | Discharge: 2012-08-03 | Disposition: A | Discharge status: Home / Self Care | Payer: Medicare Other | Source: Ambulatory Visit | Attending: Neurosurgery | Admitting: Neurosurgery

## 2012-08-03 DIAGNOSIS — M549 Dorsalgia, unspecified: Secondary | ICD-10-CM

## 2012-08-03 MED ORDER — GADOBENATE DIMEGLUMINE 529 MG/ML IV SOLN
15.0000 mL | Freq: Once | INTRAVENOUS | Status: AC | PRN
  Administered 2012-08-03: 15 mL via INTRAVENOUS

## 2012-08-05 ENCOUNTER — Other Ambulatory Visit: Payer: Medicare Other

## 2012-12-03 ENCOUNTER — Other Ambulatory Visit: Payer: Self-pay

## 2012-12-03 DIAGNOSIS — Z1231 Encounter for screening mammogram for malignant neoplasm of breast: Secondary | ICD-10-CM

## 2012-12-17 ENCOUNTER — Ambulatory Visit: Payer: Medicare Other

## 2014-02-20 ENCOUNTER — Other Ambulatory Visit: Payer: Self-pay | Admitting: Obstetrics and Gynecology

## 2014-02-20 DIAGNOSIS — Z1231 Encounter for screening mammogram for malignant neoplasm of breast: Secondary | ICD-10-CM

## 2014-02-27 ENCOUNTER — Ambulatory Visit (HOSPITAL_COMMUNITY)
Admission: RE | Admit: 2014-02-27 | Discharge: 2014-02-27 | Disposition: A | Discharge status: Home / Self Care | Payer: Medicare Other | Source: Ambulatory Visit | Attending: Obstetrics and Gynecology | Admitting: Obstetrics and Gynecology

## 2014-02-27 DIAGNOSIS — N63 Unspecified lump in breast: Secondary | ICD-10-CM | POA: Diagnosis not present

## 2014-02-27 DIAGNOSIS — Z1231 Encounter for screening mammogram for malignant neoplasm of breast: Secondary | ICD-10-CM | POA: Insufficient documentation

## 2014-12-18 ENCOUNTER — Other Ambulatory Visit (HOSPITAL_COMMUNITY): Payer: Medicare Other

## 2015-01-03 NOTE — Patient Instructions (Signed)
Your procedure is scheduled on:  01/08/2015  Report to [facility] at 6:30     AM.  Call this number if you have problems the morning of surgery: [phone]   Remember:   Do not eat or drink :After Midnight.    Take these medicines the morning of surgery with A SIP OF WATER: Tramadol and    Do not wear jewelry, make-up or nail polish.  Do not wear lotions, powders, or perfumes. You may wear deodorant.  Do not shave 48 hours prior to surgery.  Do not bring valuables to the hospital.  Contacts, dentures or bridgework may not be worn into surgery.  Patients discharged the day of surgery will not be allowed to drive home.  Name and phone number of your driver:    Please read over the following fact sheets that you were given: Pain Booklet, Surgical Site Infection Prevention, Anesthesia Post-op Instructions and Care and Recovery After Surgery  Cataract Surgery  A cataract is a clouding of the lens of the eye. When a lens becomes cloudy, vision is reduced based on the degree and nature of the clouding. Surgery may be needed to improve vision. Surgery removes the cloudy lens and usually replaces it with a substitute lens (intraocular lens, IOL). LET YOUR EYE DOCTOR KNOW ABOUT:  Allergies to food or medicine.   Medicines taken including herbs, eyedrops, over-the-counter medicines, and creams.   Use of steroids (by mouth or creams).   Previous problems with anesthetics or numbing medicine.   History of bleeding problems or blood clots.   Previous surgery.   Other health problems, including diabetes and kidney problems.   Possibility of pregnancy, if this applies.  RISKS AND COMPLICATIONS  Infection.   Inflammation of the eyeball (endophthalmitis) that can spread to both eyes (sympathetic ophthalmia).   Poor wound healing.   If an IOL is inserted, it can later fall out of proper position. This is very uncommon.   Clouding of the part of your eye that holds an IOL in place.  This is called an "after-cataract." These are uncommon, but easily treated.  BEFORE THE PROCEDURE  Do not eat or drink anything except small amounts of water for 8 to 12 before your surgery, or as directed by your caregiver.   Unless you are told otherwise, continue any eyedrops you have been prescribed.   Talk to your primary caregiver about all other medicines that you take (both prescription and non-prescription). In some cases, you may need to stop or change medicines near the time of your surgery. This is most important if you are taking blood-thinning medicine.Do not stop medicines unless you are told to do so.   Arrange for someone to drive you to and from the procedure.   Do not put contact lenses in either eye on the day of your surgery.  PROCEDURE There is more than one method for safely removing a cataract. Your doctor can explain the differences and help determine which is best for you. Phacoemulsification surgery is the most common form of cataract surgery.  An injection is given behind the eye or eyedrops are given to make this a painless procedure.   A small cut (incision) is made on the edge of the clear, dome-shaped surface that covers the front of the eye (cornea).   A tiny probe is painlessly inserted into the eye. This device gives off ultrasound waves that soften and break up the cloudy center of the lens. This makes it  easier for the cloudy lens to be removed by suction.   An IOL may be implanted.   The normal lens of the eye is covered by a clear capsule. Part of that capsule is intentionally left in the eye to support the IOL.   Your surgeon may or may not use stitches to close the incision.  There are other forms of cataract surgery that require a larger incision and stiches to close the eye. This approach is taken in cases where the doctor feels that the cataract cannot be easily removed using phacoemulsification. AFTER THE PROCEDURE  When an IOL is  implanted, it does not need care. It becomes a permanent part of your eye and cannot be seen or felt.   Your doctor will schedule follow-up exams to check on your progress.   Review your other medicines with your doctor to see which can be resumed after surgery.   Use eyedrops or take medicine as prescribed by your doctor.  Document Released: 01/30/2011 Document Reviewed: 01/27/2011 [facility] Patient Information 2012 [place].  .Cataract Surgery Care After Refer to this sheet in the next few weeks. These instructions provide you with information on caring for yourself after your procedure. Your caregiver may also give you more specific instructions. Your treatment has been planned according to current medical practices, but problems sometimes occur. Call your caregiver if you have any problems or questions after your procedure.  HOME CARE INSTRUCTIONS   Avoid strenuous activities as directed by your caregiver.   Ask your caregiver when you can resume driving.   Use eyedrops or other medicines to help healing and control pressure inside your eye as directed by your caregiver.   Only take over-the-counter or prescription medicines for pain, discomfort, or fever as directed by your caregiver.   Do not to touch or rub your eyes.   You may be instructed to use a protective shield during the first few days and nights after surgery. If not, wear sunglasses to protect your eyes. This is to protect the eye from pressure or from being accidentally bumped.   Keep the area around your eye clean and dry. Avoid swimming or allowing water to hit you directly in the face while showering. Keep soap and shampoo out of your eyes.   Do not bend or lift heavy objects. Bending increases pressure in the eye. You can walk, climb stairs, and do light household chores.   Do not put a contact lens into the eye that had surgery until your caregiver says it is okay to do so.   Ask your doctor when you can  return to work. This will depend on the kind of work that you do. If you work in a dusty environment, you may be advised to wear protective eyewear for a period of time.   Ask your caregiver when it will be safe to engage in sexual activity.   Continue with your regular eye exams as directed by your caregiver.  What to expect:  It is normal to feel itching and mild discomfort for a few days after cataract surgery. Some fluid discharge is also common, and your eye may be sensitive to light and touch.   After 1 to 2 days, even moderate discomfort should disappear. In most cases, healing will take about 6 weeks.   If you received an intraocular lens (IOL), you may notice that colors are very bright or have a blue tinge. Also, if you have been in bright sunlight, everything  may appear reddish for a few hours. If you see these color tinges, it is because your lens is clear and no longer cloudy. Within a few months after receiving an IOL, these extra colors should go away. When you have healed, you will probably need new glasses.  SEEK MEDICAL CARE IF:   You have increased bruising around your eye.   You have discomfort not helped by medicine.  SEEK IMMEDIATE MEDICAL CARE IF:   You have a fever.   You have a worsening or sudden vision loss.   You have redness, swelling, or increasing pain in the eye.   You have a thick discharge from the eye that had surgery.  MAKE SURE YOU:  Understand these instructions.   Will watch your condition.   Will get help right away if you are not doing well or get worse.  Document Released: 08/30/2004 Document Revised: 01/30/2011 Document Reviewed: 10/04/2010 [facility] Patient Information 2012 [place].    Monitored Anesthesia Care  Monitored anesthesia care is an anesthesia service for a medical procedure. Anesthesia is the loss of the ability to feel pain. It is produced by medications called anesthetics. It may affect a small area of your body  (local anesthesia), a large area of your body (regional anesthesia), or your entire body (general anesthesia). The need for monitored anesthesia care depends your procedure, your condition, and the potential need for regional or general anesthesia. It is often provided during procedures where:   General anesthesia may be needed if there are complications. This is because you need special care when you are under general anesthesia.   You will be under local or regional anesthesia. This is so that you are able to have higher levels of anesthesia if needed.   You will receive calming medications (sedatives). This is especially the case if sedatives are given to put you in a semi-conscious state of relaxation (deep sedation). This is because the amount of sedative needed to produce this state can be hard to predict. Too much of a sedative can produce general anesthesia. Monitored anesthesia care is performed by one or more caregivers who have special training in all types of anesthesia. You will need to meet with these caregivers before your procedure. During this meeting, they will ask you about your medical history. They will also give you instructions to follow. (For example, you will need to stop eating and drinking before your procedure. You may also need to stop or change medications you are taking.) During your procedure, your caregivers will stay with you. They will:   Watch your condition. This includes watching you blood pressure, breathing, and level of pain.   Diagnose and treat problems that occur.   Give medications if they are needed. These may include calming medications (sedatives) and anesthetics.   Make sure you are comfortable.  Having monitored anesthesia care does not necessarily mean that you will be under anesthesia. It does mean that your caregivers will be able to manage anesthesia if you need it or if it occurs. It also means that you will be able to have a different type  of anesthesia than you are having if you need it. When your procedure is complete, your caregivers will continue to watch your condition. They will make sure any medications wear off before you are allowed to go home.  Document Released: 11/06/2004 Document Revised: 06/07/2012 Document Reviewed: 03/24/2012 [facility] Patient Information 2014 [place], [place].

## 2015-01-04 ENCOUNTER — Encounter (HOSPITAL_COMMUNITY)
Admission: RE | Admit: 2015-01-04 | Discharge: 2015-01-04 | Disposition: A | Discharge status: Home / Self Care | Payer: Medicare Other | Source: Ambulatory Visit | Attending: Ophthalmology | Admitting: Ophthalmology

## 2015-01-12 NOTE — Patient Instructions (Signed)
[name]  01/12/2015     @PREFPERIOPPHARMACY @   Your procedure is scheduled on 01/22/2015  Report to [name] at 9:30 A.M.  Call this number if you have problems the morning of surgery:  [phone]   Remember:  Do not eat food or drink liquids after midnight.  Take these medicines the morning of surgery with A SIP OF WATER    Do not wear jewelry, make-up or nail polish.  Do not wear lotions, powders, or perfumes.  You may wear deodorant.  Do not shave 48 hours prior to surgery.  Men may shave face and neck.  Do not bring valuables to the hospital.  [facility] is not responsible for any belongings or valuables.  Contacts, dentures or bridgework may not be worn into surgery.  Leave your suitcase in the car.  After surgery it may be brought to your room.  For patients admitted to the hospital, discharge time will be determined by your treatment team.  Patients discharged the day of surgery will not be allowed to drive home.   Please read over the following fact sheets that you were given. Anesthesia Post-op Instructions     PATIENT INSTRUCTIONS POST-ANESTHESIA  IMMEDIATELY FOLLOWING SURGERY:  Do not drive or operate machinery for the first twenty four hours after surgery.  Do not make any important decisions for twenty four hours after surgery or while taking narcotic pain medications or sedatives.  If you develop intractable nausea and vomiting or a severe headache please notify your doctor immediately.  FOLLOW-UP:  Please make an appointment with your surgeon as instructed. You do not need to follow up with anesthesia unless specifically instructed to do so.  WOUND CARE INSTRUCTIONS (if applicable):  Keep a dry clean dressing on the anesthesia/puncture wound site if there is drainage.  Once the wound has quit draining you may leave it open to air.  Generally you should leave the bandage intact for twenty four hours unless there is drainage.  If the epidural site  drains for more than 36-48 hours please call the anesthesia department.  QUESTIONS?:  Please feel free to call your physician or the hospital operator if you have any questions, and they will be happy to assist you.       A cataract is a clouding of the lens of the eye. When a lens becomes cloudy, vision is reduced based on the degree and nature of the clouding. Surgery may be needed to improve vision. Surgery removes the cloudy lens and usually replaces it with a substitute lens (intraocular lens, IOL). LET YOUR EYE DOCTOR KNOW ABOUT:  Allergies to food or medicine.  Medicines taken including herbs, eye drops, over-the-counter medicines, and creams.  Use of steroids (by mouth or creams).  Previous problems with anesthetics or numbing medicine.  History of bleeding problems or blood clots.  Previous surgery.  Other health problems, including diabetes and kidney problems.  Possibility of pregnancy, if this applies. RISKS AND COMPLICATIONS  Infection.  Inflammation of the eyeball (endophthalmitis) that can spread to both eyes (sympathetic ophthalmia).  Poor wound healing.  If an IOL is inserted, it can later fall out of proper position. This is very uncommon.  Clouding of the part of your eye that holds an IOL in place. This is called an "after-cataract." These are uncommon but easily treated. BEFORE THE PROCEDURE  Do not eat or drink anything except small amounts of water for 8 to 12 before your surgery, or as  directed by your caregiver.  Unless you are told otherwise, continue any eye drops you have been prescribed.  Talk to your primary caregiver about all other medicines that you take (both prescription and nonprescription). In some cases, you may need to stop or change medicines near the time of your surgery. This is most important if you are taking blood-thinning medicine.Do not stop medicines unless you are told to do so.  Arrange for someone to drive you to and from  the procedure.  Do not put contact lenses in either eye on the day of your surgery. PROCEDURE There is more than one method for safely removing a cataract. Your doctor can explain the differences and help determine which is best for you. Phacoemulsification surgery is the most common form of cataract surgery.  An injection is given behind the eye or eye drops are given to make this a painless procedure.  A small cut (incision) is made on the edge of the clear, dome-shaped surface that covers the front of the eye (cornea).  A tiny probe is painlessly inserted into the eye. This device gives off ultrasound waves that soften and break up the cloudy center of the lens. This makes it easier for the cloudy lens to be removed by suction.  An IOL may be implanted.  The normal lens of the eye is covered by a clear capsule. Part of that capsule is intentionally left in the eye to support the IOL.  Your surgeon may or may not use stitches to close the incision. There are other forms of cataract surgery that require a larger incision and stitches to close the eye. This approach is taken in cases where the doctor feels that the cataract cannot be easily removed using phacoemulsification. AFTER THE PROCEDURE  When an IOL is implanted, it does not need care. It becomes a permanent part of your eye and cannot be seen or felt.  Your doctor will schedule follow-up exams to check on your progress.  Review your other medicines with your doctor to see which can be resumed after surgery.  Use eye drops or take medicine as prescribed by your doctor.   This information is not intended to replace advice given to you by your health care provider. Make sure you discuss any questions you have with your health care provider.   Document Released: 01/30/2011 Document Revised: 03/03/2014 Document Reviewed: 01/30/2011 [employer] Interactive Patient Education [employer].

## 2015-01-16 ENCOUNTER — Encounter (HOSPITAL_COMMUNITY): Payer: Self-pay

## 2015-01-16 ENCOUNTER — Encounter (HOSPITAL_COMMUNITY)
Admission: RE | Admit: 2015-01-16 | Discharge: 2015-01-16 | Disposition: A | Discharge status: Home / Self Care | Payer: Medicare Other | Source: Ambulatory Visit | Attending: Ophthalmology | Admitting: Ophthalmology

## 2015-01-16 ENCOUNTER — Other Ambulatory Visit: Payer: Self-pay

## 2015-01-16 DIAGNOSIS — Z01818 Encounter for other preprocedural examination: Secondary | ICD-10-CM | POA: Diagnosis present

## 2015-01-16 DIAGNOSIS — H2512 Age-related nuclear cataract, left eye: Secondary | ICD-10-CM | POA: Diagnosis not present

## 2015-01-16 HISTORY — DX: Hyperlipidemia, unspecified: E78.5

## 2015-01-16 HISTORY — DX: Anxiety disorder, unspecified: F41.9

## 2015-01-16 LAB — BASIC METABOLIC PANEL
ANION GAP: 7 (ref 5–15)
BUN: 12 mg/dL (ref 6–20)
CHLORIDE: 105 mmol/L (ref 101–111)
CO2: 26 mmol/L (ref 22–32)
Calcium: 8.8 mg/dL — ABNORMAL LOW (ref 8.9–10.3)
Creatinine, Ser: 0.78 mg/dL (ref 0.44–1.00)
GFR calc Af Amer: >60 mL/min (ref >60)
GFR calc non Af Amer: >60 mL/min (ref >60)
GLUCOSE: 79 mg/dL (ref 65–99)
POTASSIUM: 4 mmol/L (ref 3.5–5.1)
Sodium: 138 mmol/L (ref 135–145)

## 2015-01-16 LAB — CBC
HEMATOCRIT: 42.6 % (ref 36.0–46.0)
HEMOGLOBIN: 14.3 g/dL (ref 12.0–15.0)
MCH: 29.7 pg (ref 26.0–34.0)
MCHC: 33.6 g/dL (ref 30.0–36.0)
MCV: 88.6 fL (ref 78.0–100.0)
Platelets: 156 10*3/uL (ref 150–400)
RBC: 4.81 MIL/uL (ref 3.87–5.11)
RDW: 12.9 % (ref 11.5–15.5)
WBC: 8.9 10*3/uL (ref 4.0–10.5)

## 2015-01-16 NOTE — Patient Instructions (Addendum)
Your procedure is scheduled on:  01/22/2015               Report to [facility] at    9:30  AM.  Call this number if you have problems the morning of surgery: [phone]   Remember:   Do not eat or drink :After Midnight.    Take these medicines the morning of surgery with A SIP OF WATER:Singuliar  Do not wear jewelry, make-up or nail polish.  Do not wear lotions, powders, or perfumes. You may wear deodorant.  Do not shave 48 hours prior to surgery.  Do not bring valuables to the hospital.  Contacts, dentures or bridgework may not be worn into surgery.  Patients discharged the day of surgery will not be allowed to drive home.  Name and phone number of your driver:    @[email or web address] @ Cataract Surgery  A cataract is a clouding of the lens of the eye. When a lens becomes cloudy, vision is reduced based on the degree and nature of the clouding. Surgery may be needed to improve vision. Surgery removes the cloudy lens and usually replaces it with a substitute lens (intraocular lens, IOL). LET YOUR EYE DOCTOR KNOW ABOUT:  Allergies to food or medicine.   Medicines taken including herbs, eyedrops, over-the-counter medicines, and creams.   Use of steroids (by mouth or creams).   Previous problems with anesthetics or numbing medicine.   History of bleeding problems or blood clots.   Previous surgery.   Other health problems, including diabetes and kidney problems.   Possibility of pregnancy, if this applies.  RISKS AND COMPLICATIONS  Infection.   Inflammation of the eyeball (endophthalmitis) that can spread to both eyes (sympathetic ophthalmia).   Poor wound healing.   If an IOL is inserted, it can later fall out of proper position. This is very uncommon.   Clouding of the part of your eye that holds an IOL in place. This is called an "after-cataract." These are uncommon, but easily treated.  BEFORE THE PROCEDURE  Do not eat or drink anything except small amounts of water for  8 to 12 before your surgery, or as directed by your caregiver.   Unless you are told otherwise, continue any eyedrops you have been prescribed.   Talk to your primary caregiver about all other medicines that you take (both prescription and non-prescription). In some cases, you may need to stop or change medicines near the time of your surgery. This is most important if you are taking blood-thinning medicine.Do not stop medicines unless you are told to do so.   Arrange for someone to drive you to and from the procedure.   Do not put contact lenses in either eye on the day of your surgery.  PROCEDURE There is more than one method for safely removing a cataract. Your doctor can explain the differences and help determine which is best for you. Phacoemulsification surgery is the most common form of cataract surgery.  An injection is given behind the eye or eyedrops are given to make this a painless procedure.   A small cut (incision) is made on the edge of the clear, dome-shaped surface that covers the front of the eye (cornea).   A tiny probe is painlessly inserted into the eye. This device gives off ultrasound waves that soften and break up the cloudy center of the lens. This makes it easier for the cloudy lens to be removed by suction.   An IOL may be implanted.  The normal lens of the eye is covered by a clear capsule. Part of that capsule is intentionally left in the eye to support the IOL.   Your surgeon may or may not use stitches to close the incision.  There are other forms of cataract surgery that require a larger incision and stiches to close the eye. This approach is taken in cases where the doctor feels that the cataract cannot be easily removed using phacoemulsification. AFTER THE PROCEDURE  When an IOL is implanted, it does not need care. It becomes a permanent part of your eye and cannot be seen or felt.   Your doctor will schedule follow-up exams to check on your progress.    Review your other medicines with your doctor to see which can be resumed after surgery.   Use eyedrops or take medicine as prescribed by your doctor.  Document Released: 01/30/2011 Document Reviewed: 01/27/2011 [facility] Patient Information 2012 [place].  .Cataract Surgery Care After Refer to this sheet in the next few weeks. These instructions provide you with information on caring for yourself after your procedure. Your caregiver may also give you more specific instructions. Your treatment has been planned according to current medical practices, but problems sometimes occur. Call your caregiver if you have any problems or questions after your procedure.  HOME CARE INSTRUCTIONS   Avoid strenuous activities as directed by your caregiver.   Ask your caregiver when you can resume driving.   Use eyedrops or other medicines to help healing and control pressure inside your eye as directed by your caregiver.   Only take over-the-counter or prescription medicines for pain, discomfort, or fever as directed by your caregiver.   Do not to touch or rub your eyes.   You may be instructed to use a protective shield during the first few days and nights after surgery. If not, wear sunglasses to protect your eyes. This is to protect the eye from pressure or from being accidentally bumped.   Keep the area around your eye clean and dry. Avoid swimming or allowing water to hit you directly in the face while showering. Keep soap and shampoo out of your eyes.   Do not bend or lift heavy objects. Bending increases pressure in the eye. You can walk, climb stairs, and do light household chores.   Do not put a contact lens into the eye that had surgery until your caregiver says it is okay to do so.   Ask your doctor when you can return to work. This will depend on the kind of work that you do. If you work in a dusty environment, you may be advised to wear protective eyewear for a period of time.    Ask your caregiver when it will be safe to engage in sexual activity.   Continue with your regular eye exams as directed by your caregiver.  What to expect:  It is normal to feel itching and mild discomfort for a few days after cataract surgery. Some fluid discharge is also common, and your eye may be sensitive to light and touch.   After 1 to 2 days, even moderate discomfort should disappear. In most cases, healing will take about 6 weeks.   If you received an intraocular lens (IOL), you may notice that colors are very bright or have a blue tinge. Also, if you have been in bright sunlight, everything may appear reddish for a few hours. If you see these color tinges, it is because your lens is  clear and no longer cloudy. Within a few months after receiving an IOL, these extra colors should go away. When you have healed, you will probably need new glasses.  SEEK MEDICAL CARE IF:   You have increased bruising around your eye.   You have discomfort not helped by medicine.  SEEK IMMEDIATE MEDICAL CARE IF:   You have a fever.   You have a worsening or sudden vision loss.   You have redness, swelling, or increasing pain in the eye.   You have a thick discharge from the eye that had surgery.  MAKE SURE YOU:  Understand these instructions.   Will watch your condition.   Will get help right away if you are not doing well or get worse.  Document Released: 08/30/2004 Document Revised: 01/30/2011 Document Reviewed: 10/04/2010 [facility] Patient Information 2012 [place].    Monitored Anesthesia Care  Monitored anesthesia care is an anesthesia service for a medical procedure. Anesthesia is the loss of the ability to feel pain. It is produced by medications called anesthetics. It may affect a small area of your body (local anesthesia), a large area of your body (regional anesthesia), or your entire body (general anesthesia). The need for monitored anesthesia care depends your  procedure, your condition, and the potential need for regional or general anesthesia. It is often provided during procedures where:   General anesthesia may be needed if there are complications. This is because you need special care when you are under general anesthesia.   You will be under local or regional anesthesia. This is so that you are able to have higher levels of anesthesia if needed.   You will receive calming medications (sedatives). This is especially the case if sedatives are given to put you in a semi-conscious state of relaxation (deep sedation). This is because the amount of sedative needed to produce this state can be hard to predict. Too much of a sedative can produce general anesthesia. Monitored anesthesia care is performed by one or more caregivers who have special training in all types of anesthesia. You will need to meet with these caregivers before your procedure. During this meeting, they will ask you about your medical history. They will also give you instructions to follow. (For example, you will need to stop eating and drinking before your procedure. You may also need to stop or change medications you are taking.) During your procedure, your caregivers will stay with you. They will:   Watch your condition. This includes watching you blood pressure, breathing, and level of pain.   Diagnose and treat problems that occur.   Give medications if they are needed. These may include calming medications (sedatives) and anesthetics.   Make sure you are comfortable.  Having monitored anesthesia care does not necessarily mean that you will be under anesthesia. It does mean that your caregivers will be able to manage anesthesia if you need it or if it occurs. It also means that you will be able to have a different type of anesthesia than you are having if you need it. When your procedure is complete, your caregivers will continue to watch your condition. They will make sure any  medications wear off before you are allowed to go home.  Document Released: 11/06/2004 Document Revised: 06/07/2012 Document Reviewed: 03/24/2012 [facility] Patient Information 2014 [place], [place].

## 2015-01-22 ENCOUNTER — Encounter (HOSPITAL_COMMUNITY): Payer: Self-pay | Admitting: *Deleted

## 2015-01-22 ENCOUNTER — Ambulatory Visit (HOSPITAL_COMMUNITY)
Admission: RE | Admit: 2015-01-22 | Discharge: 2015-01-22 | Disposition: A | Discharge status: Home / Self Care | Payer: Medicare Other | Source: Ambulatory Visit | Attending: Ophthalmology | Admitting: Ophthalmology

## 2015-01-22 ENCOUNTER — Ambulatory Visit (HOSPITAL_COMMUNITY): Payer: Medicare Other | Admitting: Anesthesiology

## 2015-01-22 ENCOUNTER — Encounter (HOSPITAL_COMMUNITY)
Admission: RE | Disposition: A | Discharge status: Home / Self Care | Payer: Self-pay | Source: Ambulatory Visit | Attending: Ophthalmology

## 2015-01-22 DIAGNOSIS — Z79899 Other long term (current) drug therapy: Secondary | ICD-10-CM | POA: Insufficient documentation

## 2015-01-22 DIAGNOSIS — H2512 Age-related nuclear cataract, left eye: Secondary | ICD-10-CM | POA: Insufficient documentation

## 2015-01-22 DIAGNOSIS — E785 Hyperlipidemia, unspecified: Secondary | ICD-10-CM | POA: Diagnosis not present

## 2015-01-22 DIAGNOSIS — F419 Anxiety disorder, unspecified: Secondary | ICD-10-CM | POA: Diagnosis not present

## 2015-01-22 HISTORY — PX: CATARACT EXTRACTION W/PHACO: SHX586

## 2015-01-22 SURGERY — PHACOEMULSIFICATION, CATARACT, WITH IOL INSERTION
Anesthesia: Monitor Anesthesia Care | Site: Eye | Laterality: Left

## 2015-01-22 MED ORDER — PHENYLEPHRINE HCL 2.5 % OP SOLN
1.0000 [drp] | OPHTHALMIC | Status: AC
  Administered 2015-01-22 (×3): 1 [drp] via OPHTHALMIC

## 2015-01-22 MED ORDER — LIDOCAINE HCL 3.5 % OP GEL
1.0000 "application " | Freq: Once | OPHTHALMIC | Status: AC
  Administered 2015-01-22: 1 via OPHTHALMIC

## 2015-01-22 MED ORDER — LACTATED RINGERS IV SOLN
INTRAVENOUS | Status: DC
  Administered 2015-01-22: 10:00:00 via INTRAVENOUS

## 2015-01-22 MED ORDER — PROVISC 10 MG/ML IO SOLN
INTRAOCULAR | Status: DC | PRN
  Administered 2015-01-22: 0.85 mL via INTRAOCULAR

## 2015-01-22 MED ORDER — MIDAZOLAM HCL 2 MG/2ML IJ SOLN
1.0000 mg | INTRAMUSCULAR | Status: DC | PRN
  Administered 2015-01-22: 2 mg via INTRAVENOUS

## 2015-01-22 MED ORDER — BSS IO SOLN
INTRAOCULAR | Status: DC | PRN
  Administered 2015-01-22: 15 mL

## 2015-01-22 MED ORDER — LIDOCAINE 3.5 % OP GEL OPTIME - NO CHARGE
OPHTHALMIC | Status: DC | PRN
  Administered 2015-01-22: 1 [drp] via OPHTHALMIC

## 2015-01-22 MED ORDER — LIDOCAINE HCL (PF) 1 % IJ SOLN
INTRAMUSCULAR | Status: DC | PRN
  Administered 2015-01-22: .6 mL

## 2015-01-22 MED ORDER — FENTANYL CITRATE (PF) 100 MCG/2ML IJ SOLN
25.0000 ug | INTRAMUSCULAR | Status: AC
  Administered 2015-01-22 (×2): 25 ug via INTRAVENOUS

## 2015-01-22 MED ORDER — NEOMYCIN-POLYMYXIN-DEXAMETH 3.5-10000-0.1 OP SUSP
OPHTHALMIC | Status: DC | PRN
  Administered 2015-01-22: 2 [drp] via OPHTHALMIC

## 2015-01-22 MED ORDER — MIDAZOLAM HCL 2 MG/2ML IJ SOLN
INTRAMUSCULAR | Status: AC
  Filled 2015-01-22: qty 2

## 2015-01-22 MED ORDER — FENTANYL CITRATE (PF) 100 MCG/2ML IJ SOLN
INTRAMUSCULAR | Status: AC
  Filled 2015-01-22: qty 2

## 2015-01-22 MED ORDER — POVIDONE-IODINE 5 % OP SOLN
OPHTHALMIC | Status: DC | PRN
  Administered 2015-01-22: 1 via OPHTHALMIC

## 2015-01-22 MED ORDER — ONDANSETRON HCL 4 MG/2ML IJ SOLN: 4.0000 mg | Freq: Once | INTRAMUSCULAR | Status: DC | PRN

## 2015-01-22 MED ORDER — MIDAZOLAM HCL 5 MG/5ML IJ SOLN
INTRAMUSCULAR | Status: DC | PRN
  Administered 2015-01-22: 2 mg via INTRAVENOUS

## 2015-01-22 MED ORDER — TETRACAINE HCL 0.5 % OP SOLN
1.0000 [drp] | OPHTHALMIC | Status: AC
  Administered 2015-01-22 (×3): 1 [drp] via OPHTHALMIC

## 2015-01-22 MED ORDER — EPINEPHRINE HCL 1 MG/ML IJ SOLN
INTRAMUSCULAR | Status: DC | PRN
  Administered 2015-01-22: 500 mL

## 2015-01-22 MED ORDER — CYCLOPENTOLATE-PHENYLEPHRINE 0.2-1 % OP SOLN
1.0000 [drp] | OPHTHALMIC | Status: AC
  Administered 2015-01-22 (×3): 1 [drp] via OPHTHALMIC

## 2015-01-22 MED ORDER — FENTANYL CITRATE (PF) 100 MCG/2ML IJ SOLN: 25.0000 ug | INTRAMUSCULAR | Status: DC | PRN

## 2015-01-22 SURGICAL SUPPLY — 11 items

## 2015-01-22 NOTE — Transfer of Care (Signed)
Immediate Anesthesia Transfer of Care Note  Patient: [name]  Procedure(s) Performed: Procedure(s) with comments: CATARACT EXTRACTION PHACO AND INTRAOCULAR LENS PLACEMENT (IOC) (Left) - CDE: 4.36  Patient Location: Short Stay  Anesthesia Type:MAC  Level of Consciousness: awake  Airway & Oxygen Therapy: Patient Spontanous Breathing  Post-op Assessment: Report given to RN  Post vital signs: Reviewed  Last Vitals:  Filed Vitals:   01/22/15 1020 01/22/15 1025  BP: 87/56 111/62  Resp: 14 18    Complications: No apparent anesthesia complications

## 2015-01-22 NOTE — H&P (Signed)
I have reviewed the H&P, the patient was re-examined, and I have identified no interval changes in medical condition and plan of care since the history and physical of record  

## 2015-01-22 NOTE — Anesthesia Preprocedure Evaluation (Signed)
Anesthesia Evaluation  Patient identified by MRN, date of birth, ID band Patient awake    Reviewed: Allergy & Precautions, NPO status , Patient's Chart, lab work & pertinent test results  Airway Mallampati: II  TM Distance: >3 FB     Dental  (+) Teeth Intact   Pulmonary former smoker,    breath sounds clear to auscultation       Cardiovascular negative cardio ROS   Rhythm:Regular Rate:Normal     Neuro/Psych PSYCHIATRIC DISORDERS Anxiety    GI/Hepatic negative GI ROS,   Endo/Other    Renal/GU      Musculoskeletal   Abdominal   Peds  Hematology   Anesthesia Other Findings   Reproductive/Obstetrics                             Anesthesia Physical Anesthesia Plan  ASA: II  Anesthesia Plan: MAC   Post-op Pain Management:    Induction: Intravenous  Airway Management Planned: Nasal Cannula  Additional Equipment:   Intra-op Plan:   Post-operative Plan:   Informed Consent: I have reviewed the patients History and Physical, chart, labs and discussed the procedure including the risks, benefits and alternatives for the proposed anesthesia with the patient or authorized representative who has indicated his/her understanding and acceptance.     Plan Discussed with:   Anesthesia Plan Comments:         Anesthesia Quick Evaluation

## 2015-01-22 NOTE — Anesthesia Postprocedure Evaluation (Signed)
Anesthesia Post Note  Patient: [name]  Procedure(s) Performed: Procedure(s) (LRB): CATARACT EXTRACTION PHACO AND INTRAOCULAR LENS PLACEMENT (IOC) (Left)  Patient location during evaluation: Short Stay Anesthesia Type: MAC Level of consciousness: awake and alert and oriented Pain management: pain level controlled Respiratory status: spontaneous breathing Cardiovascular status: blood pressure returned to baseline and stable Postop Assessment: No signs of nausea or vomiting Anesthetic complications: no    Last Vitals:  Filed Vitals:   01/22/15 1020 01/22/15 1025  BP: 87/56 111/62  Resp: 14 18    Last Pain: There were no vitals filed for this visit.               Brianna Esson

## 2015-01-22 NOTE — Op Note (Signed)
Date of Admission: 01/22/2015  Date of Surgery: 01/22/2015   Pre-Op Dx: Cataract Left Eye  Post-Op Dx: Senile Nuclear Cataract Left  Eye,  Dx Code H25.12  Surgeon: Tonny Branch, M.D.  Assistants: None  Anesthesia: Topical with MAC  Indications: Painless, progressive loss of vision with compromise of daily activities.  Surgery: Cataract Extraction with Intraocular lens Implant Left Eye  Discription: The patient had dilating drops and viscous lidocaine placed into the Left eye in the pre-op holding area. After transfer to the operating room, a time out was performed. The patient was then prepped and draped. Beginning with a 75 degree blade a paracentesis port was made at the surgeon's 2 o'clock position. The anterior chamber was then filled with 1% non-preserved lidocaine. This was followed by filling the anterior chamber with Provisc.  A 2.50mm keratome blade was used to make a clear corneal incision at the temporal limbus.  A bent cystatome needle was used to create a continuous tear capsulotomy. Hydrodissection was performed with balanced salt solution on a Fine canula. The lens nucleus was then removed using the phacoemulsification handpiece. Residual cortex was removed with the I&A handpiece. The anterior chamber and capsular bag were refilled with Provisc. A posterior chamber intraocular lens was placed into the capsular bag with it's injector. The implant was positioned with the Kuglan hook. The Provisc was then removed from the anterior chamber and capsular bag with the I&A handpiece. Stromal hydration of the main incision and paracentesis port was performed with BSS on a Fine canula. The wounds were tested for leak which was negative. The patient tolerated the procedure well. There were no operative complications. The patient was then transferred to the recovery room in stable condition.  Complications: None  Specimen: None  EBL: None  Prosthetic device: Hoya iSert 250, power 24.0 D, SN  [id number].

## 2015-01-22 NOTE — Discharge Instructions (Signed)

## 2015-01-23 ENCOUNTER — Encounter (HOSPITAL_COMMUNITY): Payer: Self-pay | Admitting: Ophthalmology

## 2015-02-13 ENCOUNTER — Encounter (HOSPITAL_COMMUNITY): Payer: Self-pay

## 2015-02-13 ENCOUNTER — Encounter (HOSPITAL_COMMUNITY)
Admission: RE | Admit: 2015-02-13 | Discharge: 2015-02-13 | Disposition: A | Discharge status: Home / Self Care | Payer: Medicare Other | Source: Ambulatory Visit | Attending: Ophthalmology | Admitting: Ophthalmology

## 2015-02-15 ENCOUNTER — Encounter (HOSPITAL_COMMUNITY)
Admission: RE | Disposition: A | Discharge status: Home / Self Care | Payer: Self-pay | Source: Ambulatory Visit | Attending: Ophthalmology

## 2015-02-15 ENCOUNTER — Ambulatory Visit (HOSPITAL_COMMUNITY): Payer: Medicare Other | Admitting: Anesthesiology

## 2015-02-15 ENCOUNTER — Ambulatory Visit (HOSPITAL_COMMUNITY)
Admission: RE | Admit: 2015-02-15 | Discharge: 2015-02-15 | Disposition: A | Discharge status: Home / Self Care | Payer: Medicare Other | Source: Ambulatory Visit | Attending: Ophthalmology | Admitting: Ophthalmology

## 2015-02-15 ENCOUNTER — Encounter (HOSPITAL_COMMUNITY): Payer: Self-pay | Admitting: Anesthesiology

## 2015-02-15 DIAGNOSIS — Z87891 Personal history of nicotine dependence: Secondary | ICD-10-CM | POA: Insufficient documentation

## 2015-02-15 DIAGNOSIS — Z79899 Other long term (current) drug therapy: Secondary | ICD-10-CM | POA: Insufficient documentation

## 2015-02-15 DIAGNOSIS — H2511 Age-related nuclear cataract, right eye: Secondary | ICD-10-CM | POA: Insufficient documentation

## 2015-02-15 DIAGNOSIS — F419 Anxiety disorder, unspecified: Secondary | ICD-10-CM | POA: Diagnosis not present

## 2015-02-15 HISTORY — PX: CATARACT EXTRACTION W/PHACO: SHX586

## 2015-02-15 SURGERY — PHACOEMULSIFICATION, CATARACT, WITH IOL INSERTION
Anesthesia: Monitor Anesthesia Care | Site: Eye | Laterality: Right

## 2015-02-15 MED ORDER — LIDOCAINE 3.5 % OP GEL OPTIME - NO CHARGE
OPHTHALMIC | Status: DC | PRN
  Administered 2015-02-15: 1 [drp] via OPHTHALMIC

## 2015-02-15 MED ORDER — EPINEPHRINE HCL 1 MG/ML IJ SOLN
INTRAMUSCULAR | Status: DC | PRN
  Administered 2015-02-15: 500 mL

## 2015-02-15 MED ORDER — BSS IO SOLN
INTRAOCULAR | Status: DC | PRN
  Administered 2015-02-15: 15 mL via INTRAOCULAR

## 2015-02-15 MED ORDER — LIDOCAINE HCL 3.5 % OP GEL
1.0000 "application " | Freq: Once | OPHTHALMIC | Status: AC
  Administered 2015-02-15: 1 via OPHTHALMIC

## 2015-02-15 MED ORDER — TETRACAINE HCL 0.5 % OP SOLN
1.0000 [drp] | OPHTHALMIC | Status: AC
  Administered 2015-02-15 (×3): 1 [drp] via OPHTHALMIC

## 2015-02-15 MED ORDER — MIDAZOLAM HCL 2 MG/2ML IJ SOLN
1.0000 mg | INTRAMUSCULAR | Status: DC | PRN
  Administered 2015-02-15: 2 mg via INTRAVENOUS

## 2015-02-15 MED ORDER — LIDOCAINE HCL (PF) 1 % IJ SOLN
INTRAMUSCULAR | Status: DC | PRN
  Administered 2015-02-15: .5 mL

## 2015-02-15 MED ORDER — NEOMYCIN-POLYMYXIN-DEXAMETH 3.5-10000-0.1 OP SUSP
OPHTHALMIC | Status: DC | PRN
  Administered 2015-02-15: 2 [drp] via OPHTHALMIC

## 2015-02-15 MED ORDER — PHENYLEPHRINE HCL 2.5 % OP SOLN
1.0000 [drp] | OPHTHALMIC | Status: AC
  Administered 2015-02-15 (×3): 1 [drp] via OPHTHALMIC

## 2015-02-15 MED ORDER — POVIDONE-IODINE 5 % OP SOLN
OPHTHALMIC | Status: DC | PRN
  Administered 2015-02-15: 1 via OPHTHALMIC

## 2015-02-15 MED ORDER — LACTATED RINGERS IV SOLN
INTRAVENOUS | Status: DC
  Administered 2015-02-15: 08:00:00 via INTRAVENOUS

## 2015-02-15 MED ORDER — CYCLOPENTOLATE-PHENYLEPHRINE 0.2-1 % OP SOLN
1.0000 [drp] | OPHTHALMIC | Status: AC
  Administered 2015-02-15 (×3): 1 [drp] via OPHTHALMIC

## 2015-02-15 MED ORDER — EPINEPHRINE HCL 1 MG/ML IJ SOLN
INTRAMUSCULAR | Status: AC
  Filled 2015-02-15: qty 1

## 2015-02-15 MED ORDER — MIDAZOLAM HCL 2 MG/2ML IJ SOLN
INTRAMUSCULAR | Status: AC
  Filled 2015-02-15: qty 2

## 2015-02-15 MED ORDER — PROVISC 10 MG/ML IO SOLN
INTRAOCULAR | Status: DC | PRN
  Administered 2015-02-15: 0.85 mL via INTRAOCULAR

## 2015-02-15 MED ORDER — LACTATED RINGERS IV SOLN
INTRAVENOUS | Status: DC | PRN
  Administered 2015-02-15: 08:00:00 via INTRAVENOUS

## 2015-02-15 SURGICAL SUPPLY — 34 items
CAPSULAR TENSION RING-AMO (OPHTHALMIC RELATED) IMPLANT
CLOTH BEACON ORANGE TIMEOUT ST (SAFETY) ×3 IMPLANT
EYE SHIELD UNIVERSAL CLEAR (GAUZE/BANDAGES/DRESSINGS) ×3 IMPLANT
GLOVE BIO SURGEON STRL SZ 6.5 (GLOVE) IMPLANT
GLOVE BIO SURGEONS STRL SZ 6.5 (GLOVE)
GLOVE BIOGEL PI IND STRL 6.5 (GLOVE) IMPLANT
GLOVE BIOGEL PI IND STRL 7.0 (GLOVE) ×1 IMPLANT
GLOVE BIOGEL PI IND STRL 7.5 (GLOVE) IMPLANT
GLOVE BIOGEL PI INDICATOR 6.5 (GLOVE)
GLOVE BIOGEL PI INDICATOR 7.0 (GLOVE) ×2
GLOVE BIOGEL PI INDICATOR 7.5 (GLOVE)
GLOVE ECLIPSE 6.5 STRL STRAW (GLOVE) IMPLANT
GLOVE ECLIPSE 7.0 STRL STRAW (GLOVE) IMPLANT
GLOVE ECLIPSE 7.5 STRL STRAW (GLOVE) IMPLANT
GLOVE EXAM NITRILE LRG STRL (GLOVE) IMPLANT
GLOVE EXAM NITRILE MD LF STRL (GLOVE) ×3 IMPLANT
GLOVE SKINSENSE NS SZ6.5 (GLOVE)
GLOVE SKINSENSE NS SZ7.0 (GLOVE)
GLOVE SKINSENSE STRL SZ6.5 (GLOVE) IMPLANT
GLOVE SKINSENSE STRL SZ7.0 (GLOVE) IMPLANT
KIT VITRECTOMY (OPHTHALMIC RELATED) IMPLANT
PAD ARMBOARD 7.5X6 YLW CONV (MISCELLANEOUS) ×3 IMPLANT
PROC W NO LENS (INTRAOCULAR LENS)
PROC W SPEC LENS (INTRAOCULAR LENS)
PROCESS W NO LENS (INTRAOCULAR LENS) IMPLANT
PROCESS W SPEC LENS (INTRAOCULAR LENS) IMPLANT
RETRACTOR IRIS SIGHTPATH (OPHTHALMIC RELATED) IMPLANT
RING MALYGIN (MISCELLANEOUS) IMPLANT
SIGHTPATH CAT PROC W REG LENS (Ophthalmic Related) ×3 IMPLANT
SYRINGE LUER LOK 1CC (MISCELLANEOUS) ×3 IMPLANT
TAPE SURG TRANSPORE 1 IN (GAUZE/BANDAGES/DRESSINGS) ×1 IMPLANT
TAPE SURGICAL TRANSPORE 1 IN (GAUZE/BANDAGES/DRESSINGS) ×2
VISCOELASTIC ADDITIONAL (OPHTHALMIC RELATED) ×3 IMPLANT
WATER STERILE IRR 250ML POUR (IV SOLUTION) ×3 IMPLANT

## 2015-02-15 NOTE — Anesthesia Procedure Notes (Signed)
Procedure Name: MAC Date/Time: 02/15/2015 8:19 AM Performed by: Andree Elk, Kyrstyn Greear A Pre-anesthesia Checklist: Patient identified, Timeout performed, Emergency Drugs available, Suction available and Patient being monitored Oxygen Delivery Method: Nasal cannula

## 2015-02-15 NOTE — Discharge Instructions (Signed)

## 2015-02-15 NOTE — Anesthesia Preprocedure Evaluation (Signed)
Anesthesia Evaluation  Patient identified by MRN, date of birth, ID band Patient awake    Reviewed: Allergy & Precautions, NPO status , Patient's Chart, lab work & pertinent test results  Airway Mallampati: II       Dental  (+) Missing, Poor Dentition, Dental Advisory Given, Chipped,    Pulmonary former smoker,    Pulmonary exam normal        Cardiovascular Normal cardiovascular exam     Neuro/Psych Anxiety    GI/Hepatic   Endo/Other    Renal/GU      Musculoskeletal   Abdominal Normal abdominal exam  (+)   Peds  Hematology   Anesthesia Other Findings   Reproductive/Obstetrics                             Anesthesia Physical Anesthesia Plan  ASA: II  Anesthesia Plan: MAC   Post-op Pain Management:    Induction: Intravenous  Airway Management Planned: Nasal Cannula  Additional Equipment:   Intra-op Plan:   Post-operative Plan:   Informed Consent: I have reviewed the patients History and Physical, chart, labs and discussed the procedure including the risks, benefits and alternatives for the proposed anesthesia with the patient or authorized representative who has indicated his/her understanding and acceptance.   Dental advisory given  Plan Discussed with: CRNA  Anesthesia Plan Comments:         Anesthesia Quick Evaluation

## 2015-02-15 NOTE — Op Note (Signed)
Date of Admission: 02/15/2015  Date of Surgery: 02/15/2015   Pre-Op Dx: Cataract Right Eye  Post-Op Dx: Senile Nuclear Cataract Right  Eye,  Dx Code H25.11  Surgeon: Tonny Branch, M.D.  Assistants: None  Anesthesia: Topical with MAC  Indications: Painless, progressive loss of vision with compromise of daily activities.  Surgery: Cataract Extraction with Intraocular lens Implant Right Eye  Discription: The patient had dilating drops and viscous lidocaine placed into the Right eye in the pre-op holding area. After transfer to the operating room, a time out was performed. The patient was then prepped and draped. Beginning with a 60 degree blade a paracentesis port was made at the surgeon's 2 o'clock position. The anterior chamber was then filled with 1% non-preserved lidocaine. This was followed by filling the anterior chamber with Provisc.  A 2.10mm keratome blade was used to make a clear corneal incision at the temporal limbus.  A bent cystatome needle was used to create a continuous tear capsulotomy. Hydrodissection was performed with balanced salt solution on a Fine canula. The lens nucleus was then removed using the phacoemulsification handpiece. Residual cortex was removed with the I&A handpiece. The anterior chamber and capsular bag were refilled with Provisc. A posterior chamber intraocular lens was placed into the capsular bag with it's injector. The trailing haptic was amputated. The incision was widened to 2.32mm. The partial haptic was externalized and grasped with forceps to stabilize the IOL while it was divided with Vanass scissors. The IOL was then explanted. A second IOL was injected into the capsular bag and positioned with the Kuglan hook. The Provisc was then removed from the anterior chamber and capsular bag with the I&A handpiece. Stromal hydration of the main incision and paracentesis port was performed with BSS on a Fine canula. The wounds were tested for leak which was negative.  The patient tolerated the procedure well. There were no operative complications. The patient was then transferred to the recovery room in stable condition.  Complications: None  Specimen: None  EBL: None  Prosthetic device: Hoya iSert 250, power 24.5 D, SN [id number].

## 2015-02-15 NOTE — Anesthesia Postprocedure Evaluation (Signed)
Anesthesia Post Note  Patient: [name]  Procedure(s) Performed: Procedure(s) (LRB): CATARACT EXTRACTION PHACO AND INTRAOCULAR LENS PLACEMENT; CDE:  7.51 (Right)  Patient location during evaluation: Short Stay Anesthesia Type: MAC Level of consciousness: awake and alert and oriented Pain management: pain level controlled Vital Signs Assessment: post-procedure vital signs reviewed and stable Respiratory status: spontaneous breathing and respiratory function stable Cardiovascular status: stable Postop Assessment: no signs of nausea or vomiting Anesthetic complications: no    Last Vitals:  Filed Vitals:   02/15/15 0810 02/15/15 0815  BP: 115/66 115/66  Pulse:    Temp:    Resp: 16 29    Last Pain: There were no vitals filed for this visit.               Favor Kreh A

## 2015-02-15 NOTE — H&P (Signed)
I have reviewed the H&P, the patient was re-examined, and I have identified no interval changes in medical condition and plan of care since the history and physical of record  

## 2015-02-15 NOTE — Transfer of Care (Signed)
Immediate Anesthesia Transfer of Care Note  Patient: [name]  Procedure(s) Performed: Procedure(s): CATARACT EXTRACTION PHACO AND INTRAOCULAR LENS PLACEMENT; CDE:  7.51 (Right)  Patient Location: Short Stay  Anesthesia Type:MAC  Level of Consciousness: awake, alert , oriented and patient cooperative  Airway & Oxygen Therapy: Patient Spontanous Breathing  Post-op Assessment: Report given to RN and Post -op Vital signs reviewed and stable  Post vital signs: Reviewed and stable  Last Vitals:  Filed Vitals:   02/15/15 0810 02/15/15 0815  BP: 115/66 115/66  Pulse:    Temp:    Resp: 16 29    Complications: No apparent anesthesia complications

## 2015-02-16 ENCOUNTER — Encounter (HOSPITAL_COMMUNITY): Payer: Self-pay | Admitting: Ophthalmology

## 2015-04-05 DIAGNOSIS — Z79891 Long term (current) use of opiate analgesic: Secondary | ICD-10-CM | POA: Diagnosis not present

## 2015-04-05 DIAGNOSIS — M47812 Spondylosis without myelopathy or radiculopathy, cervical region: Secondary | ICD-10-CM | POA: Diagnosis not present

## 2015-04-05 DIAGNOSIS — M25511 Pain in right shoulder: Secondary | ICD-10-CM | POA: Diagnosis not present

## 2015-04-13 ENCOUNTER — Other Ambulatory Visit: Payer: Self-pay | Admitting: Obstetrics and Gynecology

## 2015-04-13 DIAGNOSIS — Z1231 Encounter for screening mammogram for malignant neoplasm of breast: Secondary | ICD-10-CM

## 2015-04-18 ENCOUNTER — Ambulatory Visit (HOSPITAL_COMMUNITY): Payer: Medicare Other

## 2015-04-23 ENCOUNTER — Ambulatory Visit (HOSPITAL_COMMUNITY)
Admission: RE | Admit: 2015-04-23 | Discharge: 2015-04-23 | Disposition: A | Discharge status: Home / Self Care | Payer: Medicare Other | Source: Ambulatory Visit | Attending: Obstetrics and Gynecology | Admitting: Obstetrics and Gynecology

## 2015-04-23 DIAGNOSIS — E782 Mixed hyperlipidemia: Secondary | ICD-10-CM | POA: Diagnosis not present

## 2015-04-23 DIAGNOSIS — Z1231 Encounter for screening mammogram for malignant neoplasm of breast: Secondary | ICD-10-CM

## 2015-04-23 DIAGNOSIS — Z0000 Encounter for general adult medical examination without abnormal findings: Secondary | ICD-10-CM | POA: Diagnosis not present

## 2015-04-23 DIAGNOSIS — L2089 Other atopic dermatitis: Secondary | ICD-10-CM | POA: Diagnosis not present

## 2015-04-27 ENCOUNTER — Other Ambulatory Visit: Payer: Self-pay | Admitting: Obstetrics and Gynecology

## 2015-04-27 DIAGNOSIS — R928 Other abnormal and inconclusive findings on diagnostic imaging of breast: Secondary | ICD-10-CM

## 2015-04-27 DIAGNOSIS — E782 Mixed hyperlipidemia: Secondary | ICD-10-CM | POA: Diagnosis not present

## 2015-04-27 DIAGNOSIS — Z79899 Other long term (current) drug therapy: Secondary | ICD-10-CM | POA: Diagnosis not present

## 2015-05-04 ENCOUNTER — Telehealth: Payer: Self-pay | Admitting: Obstetrics and Gynecology

## 2015-05-04 NOTE — Telephone Encounter (Signed)
Pt called stating that she would like for Dr. Glo Herring to make an order for her to have an ultrasound instead of a mamo. Pt states that she called the place and they were short with her and told her the doctor would have to order it. Please contact pt

## 2015-05-07 NOTE — Telephone Encounter (Signed)
Pt encouraged to proceed with Diagnostic Mammogram, which will include u/s if needed.  I expect radiology to contact us for order indicating left breast mammogram, and possible u/s of left breast. Pt informed of this. She plans to proceed with testing.

## 2015-05-08 ENCOUNTER — Other Ambulatory Visit: Payer: Self-pay | Admitting: Obstetrics and Gynecology

## 2015-05-08 DIAGNOSIS — R928 Other abnormal and inconclusive findings on diagnostic imaging of breast: Secondary | ICD-10-CM

## 2015-05-15 ENCOUNTER — Other Ambulatory Visit: Payer: Self-pay | Admitting: Obstetrics and Gynecology

## 2015-05-15 ENCOUNTER — Ambulatory Visit (HOSPITAL_COMMUNITY)
Admission: RE | Admit: 2015-05-15 | Discharge: 2015-05-15 | Disposition: A | Discharge status: Home / Self Care | Payer: Medicare Other | Source: Ambulatory Visit | Attending: Obstetrics and Gynecology | Admitting: Obstetrics and Gynecology

## 2015-05-15 DIAGNOSIS — R928 Other abnormal and inconclusive findings on diagnostic imaging of breast: Secondary | ICD-10-CM

## 2015-05-15 DIAGNOSIS — N6489 Other specified disorders of breast: Secondary | ICD-10-CM | POA: Diagnosis not present

## 2015-05-22 ENCOUNTER — Other Ambulatory Visit (HOSPITAL_COMMUNITY): Payer: Self-pay | Admitting: Internal Medicine

## 2015-05-22 ENCOUNTER — Ambulatory Visit (HOSPITAL_COMMUNITY)
Admission: RE | Admit: 2015-05-22 | Discharge: 2015-05-22 | Disposition: A | Discharge status: Home / Self Care | Payer: Medicare Other | Source: Ambulatory Visit | Attending: Internal Medicine | Admitting: Internal Medicine

## 2015-05-22 ENCOUNTER — Ambulatory Visit (HOSPITAL_COMMUNITY)
Admission: RE | Admit: 2015-05-22 | Discharge: 2015-05-22 | Disposition: A | Discharge status: Home / Self Care | Payer: Medicare Other | Source: Ambulatory Visit | Attending: Obstetrics and Gynecology | Admitting: Obstetrics and Gynecology

## 2015-05-22 DIAGNOSIS — N63 Unspecified lump in breast: Secondary | ICD-10-CM | POA: Diagnosis not present

## 2015-05-22 DIAGNOSIS — N6320 Unspecified lump in the left breast, unspecified quadrant: Secondary | ICD-10-CM

## 2015-05-22 DIAGNOSIS — D1809 Hemangioma of other sites: Secondary | ICD-10-CM | POA: Insufficient documentation

## 2015-05-22 DIAGNOSIS — R928 Other abnormal and inconclusive findings on diagnostic imaging of breast: Secondary | ICD-10-CM | POA: Diagnosis not present

## 2015-05-22 MED ORDER — LIDOCAINE HCL (PF) 1 % IJ SOLN
INTRAMUSCULAR | Status: AC
  Filled 2015-05-22: qty 10

## 2015-05-22 NOTE — Discharge Instructions (Signed)
Breast Biopsy, Care After °These instructions give you information on caring for yourself after your procedure. Your doctor may also give you more specific instructions. Call your doctor if you have any problems or questions after your procedure. °HOME CARE °· Only take medicine as told by your doctor. °· Do not take aspirin. °· Keep your sutures (stitches) dry when bathing. °· Protect the biopsy area. Do not let the area get bumped. °· Avoid activities that could pull the biopsy site open until your doctor approves. This includes: °· Stretching. °· Reaching. °· Exercise. °· Sports. °· Lifting more than 3lb. °· Continue your normal diet. °· Wear a good support bra for as long as told by your doctor. °· Change any bandages (dressings) as told by your doctor. °· Do not drink alcohol while taking pain medicine. °· Keep all doctor visits as told. Ask when your test results will be ready. Make sure you get your test results. °GET HELP RIGHT AWAY IF:  °· You have a fever. °· You have more bleeding (more than a small spot) from the biopsy site. °· You have trouble breathing. °· You have yellowish-white fluid (pus) coming from the biopsy site. °· You have redness, puffiness (swelling), or more pain in the biopsy site. °· You have a bad smell coming from the biopsy site. °· Your biopsy site opens after sutures, staples, or sticky strips have been removed. °· You have a rash. °· You need stronger medicine. °MAKE SURE YOU: °· Understand these instructions. °· Will watch your condition. °· Will get help right away if you are not doing well or get worse. °  °This information is not intended to replace advice given to you by your health care provider. Make sure you discuss any questions you have with your health care provider. °  °Document Released: 12/07/2008 Document Revised: 03/03/2014 Document Reviewed: 03/23/2011 °Elsevier Interactive Patient Education ©2016 [employer] ° °Breast Biopsy °A breast biopsy is a test during  which a sample of tissue is taken from your breast. The breast tissue is looked at under a microscope for cancer cells.  °BEFORE THE PROCEDURE °· Make plans to have someone drive you home after the test. °· Do not smoke for 2 weeks before the test. Stop smoking, if you smoke. °· Do not drink alcohol for 24 hours before the test. °· Wear a good support bra to the test. °· Your doctor may do a procedure to place a wire or a seed that gives off radiation in the breast lump. The wire or seed will help your doctor see the lump when doing the biopsy. °PROCEDURE  °You may be given one of the following: °· A medicine to numb the breast area (local anesthetic). °· A medicine to make you fall asleep (general anesthetic). °There are different types of breast biopsies. They include: °· Fine-needle aspiration. °¨ A needle is put into the breast lump. °¨ The needle takes out fluid and cells from the lump. °¨ Ultrasound imaging may be used to help find the lump and to put the needle in the right spot. °· Core-needle biopsy. °¨ A needle is put into the breast lump. °¨ The needle is put in your breast 3-6 times. °¨ The needle removes breast tissue. °¨ An ultrasound image or X-ray is often used to find the right spot to put in the needle. °· Stereotactic biopsy. °¨ X-rays and a computer are used to study X-ray pictures of the breast lump. °¨ The computer finds   where the needle needs to be put into the breast. °¨ Tissue samples are taken out. °· Vacuum-assisted biopsy. °¨ A small cut (incision) is made in your breast. °¨ A biopsy device is put through the cut and into the breast tissue. °¨ The biopsy device draws abnormal breast tissue into the biopsy device. °¨ A large tissue sample is often removed. °¨ No stitches are needed. °· Ultrasound-guided core-needle biopsy. °¨ Ultrasound imaging helps guide the needle into the area of the breast that is not normal. °¨ A cut is made in the breast. The needle is put into the breast  lump. °¨ Tissue samples are taken out. °· Open biopsy. °¨ A large cut is made in the breast. °¨ Your doctor will try to remove the whole breast lump or as much as possible. °All tissue, fluid, or cell samples are looked at under a microscope.  °AFTER THE PROCEDURE °· You will be taken to an area to recover. You will be able to go home once you are doing well and are without problems. °· You may have bruising on your breast. This is normal. °· A pressure bandage (dressing) may be put on your breast for 24-48 hours. This type of bandage is wrapped tightly around your chest. It helps stop fluid from building up underneath tissues. °  °This information is not intended to replace advice given to you by your health care provider. Make sure you discuss any questions you have with your health care provider. °  °Document Released: 05/05/2011 Document Revised: 11/01/2014 Document Reviewed: 05/05/2011 °Elsevier Interactive Patient Education ©2016 Elsevier Inc. ° °

## 2015-05-28 ENCOUNTER — Encounter: Payer: Self-pay | Admitting: Obstetrics and Gynecology

## 2015-05-28 ENCOUNTER — Ambulatory Visit (INDEPENDENT_AMBULATORY_CARE_PROVIDER_SITE_OTHER): Payer: Medicare Other | Admitting: Obstetrics and Gynecology

## 2015-05-28 ENCOUNTER — Other Ambulatory Visit (HOSPITAL_COMMUNITY)
Admission: RE | Admit: 2015-05-28 | Discharge: 2015-05-28 | Disposition: A | Discharge status: Home / Self Care | Payer: Medicare Other | Source: Ambulatory Visit | Attending: Obstetrics and Gynecology | Admitting: Obstetrics and Gynecology

## 2015-05-28 VITALS — BP 110/76 | Ht 67.0 in | Wt 193.0 lb

## 2015-05-28 DIAGNOSIS — Z01419 Encounter for gynecological examination (general) (routine) without abnormal findings: Secondary | ICD-10-CM | POA: Insufficient documentation

## 2015-05-28 DIAGNOSIS — D1801 Hemangioma of skin and subcutaneous tissue: Secondary | ICD-10-CM

## 2015-05-28 DIAGNOSIS — Z1151 Encounter for screening for human papillomavirus (HPV): Secondary | ICD-10-CM | POA: Insufficient documentation

## 2015-05-28 NOTE — Progress Notes (Signed)
Patient ID: [name], female   DOB: Jun 06, 1955, 60 y.o.   MRN: [id number] Pt here today for annual exam. Pt also to discuss breast biopsy and surgery.

## 2015-05-28 NOTE — Progress Notes (Signed)
Patient ID: [name], female   DOB: 16-Jul-1955, 60 y.o.   MRN: [id number]  Assessment:   1.  Hemangioma left breast, referred to [facility] 2.  Normal GYN exam, post menopausal female. 3.  Pap completed, repeat in three years 4.  hemoccult test negative  Annual Gyn Exam Plan:  1. pap smear done, next pap due in 3 years  2. return annually or prn 3    Annual mammogram advised 4.   Incisional breast biopsy referral  Subjective:  [name] is a 60 y.o. female No obstetric history on file. who presents for annual exam. No LMP recorded (lmp unknown). Patient is postmenopausal. The patient, with PMHx noted below, complains of increased anxiety which she equates with her current endeavor to start a new business. Associated Sx include sleep disturbance. Pt also complains of recurrent shingles around her bilateral eyes and left cheek. Pt also complains of weight gain recently due to decreased activity.   Pt also wishes to discuss the results of her breast biopsy. Pt had a core needle biopsy at the 12 o'clock position of the left breast on 05/22/15 completed by Dr. Curlene Dolphin at [facility]. The biopsy showed vascular proliferation, relatively well-circumscribed vascular proliferation that is characterized by capillary size vessels without significant atypia, and the morphological features favored hemangioma. Dr. Radford Pax in consult with pathology recommended surgical excision.   The following portions of the patient's history were reviewed and updated as appropriate: allergies, current medications, past family history, past medical history, past social history, past surgical history and problem list. Past Medical History  Diagnosis Date  . Anxiety   . Hyperlipemia     Past Surgical History  Procedure Laterality Date  . Tonsillectomy    . Tubal ligation    . Hip fracture surgery Right   . Pilonidal cyst excision    . Back surgery    . Cholecystectomy    . Cataract extraction  w/phaco Left 01/22/2015    Procedure: CATARACT EXTRACTION PHACO AND INTRAOCULAR LENS PLACEMENT (IOC);  Surgeon: Tonny Branch, MD;  Location: [facility];  Service: Ophthalmology;  Laterality: Left;  CDE: 4.36  . Cataract extraction w/phaco Right 02/15/2015    Procedure: CATARACT EXTRACTION PHACO AND INTRAOCULAR LENS PLACEMENT; CDE:  7.51;  Surgeon: Tonny Branch, MD;  Location: [facility];  Service: Ophthalmology;  Laterality: Right;     Current outpatient prescriptions:  .  B Complex-C (SUPER B COMPLEX PO), Take 1 tablet by mouth daily., Disp: , Rfl:  .  Cholecalciferol (VITAMIN D3) 5000 UNITS CAPS, Take 1 capsule by mouth daily., Disp: , Rfl:  .  Coenzyme Q10 (COQ10 PO), Take 1 tablet by mouth 2 (two) times daily., Disp: , Rfl:  .  hydrOXYzine (ATARAX/VISTARIL) 25 MG tablet, Take 25 mg by mouth 3 (three) times daily as needed., Disp: , Rfl:  .  levocetirizine (XYZAL) 5 MG tablet, Take 5 mg by mouth every evening., Disp: , Rfl:  .  Melatonin 3 MG TABS, Take 1 tablet by mouth daily., Disp: , Rfl:  .  montelukast (SINGULAIR) 10 MG tablet, Take 10 mg by mouth at bedtime., Disp: , Rfl:  .  Oxycodone HCl 10 MG TABS, Take 10 mg by mouth 3 (three) times daily as needed (pain)., Disp: , Rfl:  .  Red Yeast Rice Extract (RED YEAST RICE PO), Take 1 tablet by mouth daily., Disp: , Rfl:  .  traMADol (ULTRAM) 50 MG tablet, Take by mouth as needed., Disp: , Rfl:  Review of Systems Constitutional: negative except for sleep disturbance, anxiety, shingles to left cheek and bilateral eyes, weight gain secondary to reduced activity  Gastrointestinal: negative Genitourinary: urinary frequency q pm.  Objective:  BP 110/76 mmHg  Ht 5\' 7"  (1.702 m)  Wt 193 lb (87.544 kg)  BMI 30.22 kg/m2  LMP  (LMP Unknown)   BMI: Body mass index is 30.22 kg/(m^2).  General Appearance: Alert, appropriate appearance for age. No acute distress HEENT: Grossly normal Neck / Thyroid:  Cardiovascular: RRR; normal S1, S2, no murmur Lungs:  CTA bilaterally Back: No CVAT Breast Exam: No dimpling, nipple retraction or discharge. No masses or nodes., Normal to inspection, Normal breast tissue bilaterally and No masses or nodes.No dimpling, nipple retraction or discharge. Bruising to left outer upper bruising secondary to biopsy. Axilla neg  Gastrointestinal: Soft, non-tender, no masses or organomegaly Pelvic Exam: Vulva and vagina appear normal. Bimanual exam reveals normal uterus and adnexa. External genitalia: normal general appearance Urinary system: urethral meatus normal Vaginal: normal mucosa without prolapse or lesions, normal without tenderness, induration or masses and normal rugae, good secretions, good support Cervix: normal appearance Adnexa: normal bimanual exam Uterus: normal single, nontender and exam limited.  Rectal: good sphincter tone and guaiac negative  Rectovaginal: normal rectal, no masses and no rectocele Lymphatic Exam: Non-palpable nodes in neck, clavicular, axillary, or inguinal regions  Skin: no rash or abnormalities Neurologic: Normal gait and speech, no tremor  Psychiatric: Alert and oriented, appropriate affect. 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 MD Pgr [phone] 9:30 AM    By signing my name below, I, [name], attest that this documentation has been prepared under the direction and in the presence of Mallory Shirk, MD. Electronically Signed: [name], ED Scribe. 05/28/2015. 9:30 AM.   I personally performed the services described in this documentation, which was SCRIBED in my presence. The recorded information has been reviewed and considered accurate. It has been edited as necessary during review. Jonnie Kind, MD

## 2015-05-29 LAB — CYTOLOGY - PAP

## 2015-06-19 ENCOUNTER — Other Ambulatory Visit: Payer: Self-pay | Admitting: General Surgery

## 2015-06-19 DIAGNOSIS — R928 Other abnormal and inconclusive findings on diagnostic imaging of breast: Secondary | ICD-10-CM | POA: Diagnosis not present

## 2015-06-21 DIAGNOSIS — Z96641 Presence of right artificial hip joint: Secondary | ICD-10-CM | POA: Diagnosis not present

## 2015-06-21 DIAGNOSIS — M810 Age-related osteoporosis without current pathological fracture: Secondary | ICD-10-CM | POA: Diagnosis not present

## 2015-06-21 DIAGNOSIS — M85832 Other specified disorders of bone density and structure, left forearm: Secondary | ICD-10-CM | POA: Diagnosis not present

## 2015-06-21 DIAGNOSIS — M1990 Unspecified osteoarthritis, unspecified site: Secondary | ICD-10-CM | POA: Diagnosis not present

## 2015-06-21 DIAGNOSIS — Z780 Asymptomatic menopausal state: Secondary | ICD-10-CM | POA: Diagnosis not present

## 2015-06-21 DIAGNOSIS — Z79899 Other long term (current) drug therapy: Secondary | ICD-10-CM | POA: Diagnosis not present

## 2015-06-25 ENCOUNTER — Other Ambulatory Visit: Payer: Self-pay | Admitting: General Surgery

## 2015-06-25 DIAGNOSIS — R928 Other abnormal and inconclusive findings on diagnostic imaging of breast: Secondary | ICD-10-CM

## 2015-06-28 ENCOUNTER — Encounter (HOSPITAL_BASED_OUTPATIENT_CLINIC_OR_DEPARTMENT_OTHER): Payer: Self-pay | Admitting: *Deleted

## 2015-06-28 DIAGNOSIS — M1612 Unilateral primary osteoarthritis, left hip: Secondary | ICD-10-CM | POA: Diagnosis not present

## 2015-06-29 ENCOUNTER — Encounter (HOSPITAL_BASED_OUTPATIENT_CLINIC_OR_DEPARTMENT_OTHER)
Admission: RE | Admit: 2015-06-29 | Discharge: 2015-06-29 | Disposition: A | Discharge status: Home / Self Care | Payer: Medicare Other | Source: Ambulatory Visit | Attending: General Surgery | Admitting: General Surgery

## 2015-06-29 DIAGNOSIS — E669 Obesity, unspecified: Secondary | ICD-10-CM | POA: Diagnosis not present

## 2015-06-29 DIAGNOSIS — Z01812 Encounter for preprocedural laboratory examination: Secondary | ICD-10-CM | POA: Diagnosis not present

## 2015-06-29 DIAGNOSIS — Z6830 Body mass index (BMI) 30.0-30.9, adult: Secondary | ICD-10-CM | POA: Diagnosis not present

## 2015-06-29 DIAGNOSIS — Z79899 Other long term (current) drug therapy: Secondary | ICD-10-CM | POA: Diagnosis not present

## 2015-06-29 DIAGNOSIS — D1809 Hemangioma of other sites: Secondary | ICD-10-CM | POA: Diagnosis not present

## 2015-06-29 DIAGNOSIS — R928 Other abnormal and inconclusive findings on diagnostic imaging of breast: Secondary | ICD-10-CM | POA: Diagnosis not present

## 2015-06-29 DIAGNOSIS — Z803 Family history of malignant neoplasm of breast: Secondary | ICD-10-CM | POA: Diagnosis not present

## 2015-06-29 DIAGNOSIS — Z87891 Personal history of nicotine dependence: Secondary | ICD-10-CM | POA: Diagnosis not present

## 2015-06-29 LAB — COMPREHENSIVE METABOLIC PANEL
ALBUMIN: 3.9 g/dL (ref 3.5–5.0)
ALT: 19 U/L (ref 14–54)
ANION GAP: 9 (ref 5–15)
AST: 18 U/L (ref 15–41)
Alkaline Phosphatase: 70 U/L (ref 38–126)
BUN: 11 mg/dL (ref 6–20)
CALCIUM: 10 mg/dL (ref 8.9–10.3)
CO2: 28 mmol/L (ref 22–32)
Chloride: 104 mmol/L (ref 101–111)
Creatinine, Ser: 0.69 mg/dL (ref 0.44–1.00)
GFR calc non Af Amer: >60 mL/min (ref >60)
GLUCOSE: 94 mg/dL (ref 65–99)
POTASSIUM: 4.7 mmol/L (ref 3.5–5.1)
SODIUM: 141 mmol/L (ref 135–145)
Total Bilirubin: 0.8 mg/dL (ref 0.3–1.2)
Total Protein: 6.7 g/dL (ref 6.5–8.1)

## 2015-06-29 LAB — CBC WITH DIFFERENTIAL/PLATELET
BASOS PCT: 1 %
Basophils Absolute: 0.1 10*3/uL (ref 0.0–0.1)
EOS ABS: 0.2 10*3/uL (ref 0.0–0.7)
EOS PCT: 2 %
HCT: 43.2 % (ref 36.0–46.0)
Hemoglobin: 14.1 g/dL (ref 12.0–15.0)
LYMPHS ABS: 2.5 10*3/uL (ref 0.7–4.0)
Lymphocytes Relative: 31 %
MCH: 28.6 pg (ref 26.0–34.0)
MCHC: 32.6 g/dL (ref 30.0–36.0)
MCV: 87.6 fL (ref 78.0–100.0)
MONO ABS: 0.5 10*3/uL (ref 0.1–1.0)
MONOS PCT: 7 %
Neutro Abs: 4.8 10*3/uL (ref 1.7–7.7)
Neutrophils Relative %: 59 %
PLATELETS: 129 10*3/uL — AB (ref 150–400)
RBC: 4.93 MIL/uL (ref 3.87–5.11)
RDW: 12.8 % (ref 11.5–15.5)
WBC: 8 10*3/uL (ref 4.0–10.5)

## 2015-06-29 NOTE — Progress Notes (Signed)
Pt given Boost (berry), instructed to drink 2 hours before arrival for surgery- drink by [id number].

## 2015-07-05 ENCOUNTER — Ambulatory Visit
Admission: RE | Admit: 2015-07-05 | Discharge: 2015-07-05 | Disposition: A | Discharge status: Home / Self Care | Payer: Medicare Other | Source: Ambulatory Visit | Attending: General Surgery | Admitting: General Surgery

## 2015-07-05 DIAGNOSIS — R928 Other abnormal and inconclusive findings on diagnostic imaging of breast: Secondary | ICD-10-CM | POA: Diagnosis not present

## 2015-07-08 NOTE — H&P (Signed)
[name]  Location: [facility] Patient #: [id number] DOB: 16-May-1955 Divorced / Language: English / Race: White Female        History of Present Illness  The patient is a 60 year old female who presents with a breast mass. This is a very pleasant 60 year old female from [facility]. She is referred by Dr. Curlene Dolphin at the [facility] because of a possible hemangioma of the left breast, 11:30 position. Mallory Shirk in [place] is her PCP.  She has no prior history of breast problems. no discharge. Recent screening mammogram showed a 5 mm mass in the left breast at the 11:30 position 6 cm from the nipple. Image guided biopsy was performed. This was read by Dr. Claudette Laws and with second opinion by Dr. Gari Crown and they think this is a hemangioma but they recommended excision. She was therefore referred. She doesn't want to take any chance. She knows this is a low probability of cancer. She would like this area excised.  Past history is negative for breast disease. Colon polyps. Cataracts. Anxiety. Hyperlipidemia. Cholecystectomy. Family history reveals paternal aunt with breast cancer. Paternal grandmother had uterine cancer. Father and brother had colon cancer. Patient is divorced has 1 child. Disabled because the spine(Dr. Arnoldo Morale) and hip surgery(Dr. Maureen Ralphs). Denies tobacco or alcohol. She'll be scheduled for left breast lumpectomy with radioactive seed localization. I discussed the indications, details, techniques, and numerous risk of the surgery with her. She is aware of the risk of bleeding, infection, reoperation of cancer, cosmetic deformity, nerve damage with chronic pain or numbness, and other unforeseen problems. She understands these issues well at this time all of her questions were answered. She agrees with this plan.   Other Problems  Alcohol Abuse Anxiety Disorder Back  Pain Cholelithiasis Depression Gastric Ulcer Gastroesophageal Reflux Disease Hemorrhoids Hypercholesterolemia Vascular Disease  Past Surgical History  Colon Polyp Removal - Colonoscopy Gallbladder Surgery - Open Oral Surgery Spinal Surgery - Lower Back  Diagnostic Studies History  Colonoscopy within last year Mammogram within last year Pap Smear 1-5 years ago  Allergies  No Known Drug Allergies  Medication History  OxyCODONE HCl (10MG  Tablet, Oral) Active. TraMADol HCl (50MG  Tablet, Oral) Active. HydrOXYzine Pamoate (25MG  Capsule, Oral) Active. Montelukast Sodium (10MG  Tablet, Oral) Active. Levocetirizine Dihydrochloride (5MG  Tablet, Oral) Active. Melatonin (3MG  Tablet, Oral) Active. Coenzyme Q10 (100MG  Tablet, Oral) Active. Vitamin D3 (5000UNIT Capsule, Oral) Active. Red Yeast Rice Active. Medications Reconciled  Social History  Alcohol use Remotely quit alcohol use. No caffeine use Tobacco use Former smoker.  Family History  Alcohol Abuse Father. Colon Cancer Father. Diabetes Mellitus Father. Hypertension Father.  Pregnancy / Birth History  Age at menarche 59 years. Age of menopause 35-50 Gravida 1 Irregular periods Para 1   ROS General Present- Fatigue and Weight Gain. Not Present- Appetite Loss, Chills, Fever, Night Sweats and Weight Loss. Skin Present- Dryness. Not Present- Change in Wart/Mole, Hives, Jaundice, New Lesions, Non-Healing Wounds, Rash and Ulcer. HEENT Present- Seasonal Allergies. Not Present- Earache, Hearing Loss, Hoarseness, Nose Bleed, Oral Ulcers, Ringing in the Ears, Sinus Pain, Sore Throat, Visual Disturbances, Wears glasses/contact lenses and Yellow Eyes. Respiratory Not Present- Bloody sputum, Chronic Cough, Difficulty Breathing, Snoring and Wheezing. Cardiovascular Present- Swelling of Extremities. Not Present- Chest Pain, Difficulty Breathing Lying Down, Leg Cramps, Palpitations, Rapid Heart  Rate and Shortness of Breath. Gastrointestinal Not Present- Abdominal Pain, Bloating, Bloody Stool, Change in Bowel Habits, Chronic diarrhea, Constipation, Difficulty Swallowing, Excessive gas, Gets full  quickly at meals, Hemorrhoids, Indigestion, Nausea, Rectal Pain and Vomiting. Musculoskeletal Present- Back Pain, Joint Stiffness and Swelling of Extremities. Not Present- Joint Pain, Muscle Pain and Muscle Weakness. Neurological Not Present- Decreased Memory, Fainting, Headaches, Numbness, Seizures, Tingling, Tremor, Trouble walking and Weakness. Psychiatric Present- Anxiety. Not Present- Bipolar, Change in Sleep Pattern, Depression, Fearful and Frequent crying. Endocrine Not Present- Cold Intolerance, Excessive Hunger, Hair Changes, Heat Intolerance, Hot flashes and New Diabetes.  Vitals  Weight: 192.4 lb Height: 67in Body Surface Area: 1.99 m Body Mass Index: 30.13 kg/m  Temp.: 97.62F(Temporal)  Pulse: 80 (Regular)  BP: 126/84 (Sitting, Left Arm, Standard)     Physical Exam  General Mental Status-Alert. General Appearance-Consistent with stated age. Hydration-Well hydrated. Voice-Normal.  Head and Neck Head-normocephalic, atraumatic with no lesions or palpable masses. Trachea-midline. Thyroid Gland Characteristics - normal size and consistency.  Eye Eyeball - Bilateral-Extraocular movements intact. Sclera/Conjunctiva - Bilateral-No scleral icterus.  Chest and Lung Exam Chest and lung exam reveals -quiet, even and easy respiratory effort with no use of accessory muscles and on auscultation, normal breath sounds, no adventitious sounds and normal vocal resonance. Inspection Chest Wall - Normal. Back - normal.  Breast Note: Breasts are medium size. Soft. Nontender. No palpable mass. No ecchymoses. Nipple and areola are normal without discharge. No axillary adenopathy.   Cardiovascular Cardiovascular examination reveals -normal heart  sounds, regular rate and rhythm with no murmurs and normal pedal pulses bilaterally.  Abdomen Inspection Inspection of the abdomen reveals - No Hernias. Palpation/Percussion Palpation and Percussion of the abdomen reveal - Soft, Non Tender, No Rebound tenderness, No Rigidity (guarding) and No hepatosplenomegaly. Auscultation Auscultation of the abdomen reveals - Bowel sounds normal. Note: Well-healed right subcostal incision.   Neurologic Neurologic evaluation reveals -alert and oriented x 3 with no impairment of recent or remote memory. Mental Status-Normal.  Musculoskeletal Normal Exam - Left-Upper Extremity Strength Normal and Lower Extremity Strength Normal. Normal Exam - Right-Upper Extremity Strength Normal and Lower Extremity Strength Normal.  Lymphatic Head & Neck  General Head & Neck Lymphatics: Bilateral - Description - Normal. Axillary  General Axillary Region: Bilateral - Description - Normal. Tenderness - Non Tender.    Assessment & Plan  ABNORMAL MAMMOGRAM OF LEFT BREAST (R92.8)  Your recent imaging studies and biopsy show a small 5 mm vascular mass in the left breast at the 11:30 position. Biopsy shows hypervascularity. The pathologist think this is a hemangioma. They have recommended that this be excised There is a high probability that this is benign you state you would like this removed so there is no uncertainty, and that is reasonable  you will be scheduled for left breast lumpectomy with radioactive seed localization in the near future I discussed the indications, details, and numerous risyou of this surgery with you  Please read the breast biopsy information booklet that we gave you.    Edsel Petrin. Dalbert Batman, M.D., [facility] General and Minimally invasive Surgery Breast and Colorectal Surgery Office:   [phone] Pager:   [phone]

## 2015-07-09 ENCOUNTER — Encounter (HOSPITAL_BASED_OUTPATIENT_CLINIC_OR_DEPARTMENT_OTHER): Payer: Self-pay | Admitting: Certified Registered"

## 2015-07-09 ENCOUNTER — Ambulatory Visit (HOSPITAL_BASED_OUTPATIENT_CLINIC_OR_DEPARTMENT_OTHER): Payer: Medicare Other | Admitting: Certified Registered"

## 2015-07-09 ENCOUNTER — Ambulatory Visit
Admission: RE | Admit: 2015-07-09 | Discharge: 2015-07-09 | Disposition: A | Discharge status: Home / Self Care | Payer: Medicare Other | Source: Ambulatory Visit | Attending: General Surgery | Admitting: General Surgery

## 2015-07-09 ENCOUNTER — Ambulatory Visit (HOSPITAL_BASED_OUTPATIENT_CLINIC_OR_DEPARTMENT_OTHER)
Admission: RE | Admit: 2015-07-09 | Discharge: 2015-07-09 | Disposition: A | Discharge status: Home / Self Care | Payer: Medicare Other | Source: Ambulatory Visit | Attending: General Surgery | Admitting: General Surgery

## 2015-07-09 ENCOUNTER — Encounter (HOSPITAL_BASED_OUTPATIENT_CLINIC_OR_DEPARTMENT_OTHER)
Admission: RE | Disposition: A | Discharge status: Home / Self Care | Payer: Self-pay | Source: Ambulatory Visit | Attending: General Surgery

## 2015-07-09 DIAGNOSIS — Z6830 Body mass index (BMI) 30.0-30.9, adult: Secondary | ICD-10-CM | POA: Insufficient documentation

## 2015-07-09 DIAGNOSIS — Z803 Family history of malignant neoplasm of breast: Secondary | ICD-10-CM | POA: Insufficient documentation

## 2015-07-09 DIAGNOSIS — Z79899 Other long term (current) drug therapy: Secondary | ICD-10-CM | POA: Insufficient documentation

## 2015-07-09 DIAGNOSIS — D1809 Hemangioma of other sites: Secondary | ICD-10-CM | POA: Insufficient documentation

## 2015-07-09 DIAGNOSIS — E669 Obesity, unspecified: Secondary | ICD-10-CM | POA: Diagnosis not present

## 2015-07-09 DIAGNOSIS — R928 Other abnormal and inconclusive findings on diagnostic imaging of breast: Secondary | ICD-10-CM | POA: Diagnosis not present

## 2015-07-09 DIAGNOSIS — Z87891 Personal history of nicotine dependence: Secondary | ICD-10-CM | POA: Insufficient documentation

## 2015-07-09 DIAGNOSIS — D1801 Hemangioma of skin and subcutaneous tissue: Secondary | ICD-10-CM | POA: Diagnosis not present

## 2015-07-09 DIAGNOSIS — N63 Unspecified lump in breast: Secondary | ICD-10-CM | POA: Diagnosis not present

## 2015-07-09 HISTORY — DX: Unspecified lump in unspecified breast: N63.0

## 2015-07-09 HISTORY — DX: Unspecified osteoarthritis, unspecified site: M19.90

## 2015-07-09 HISTORY — DX: Other abnormal and inconclusive findings on diagnostic imaging of breast: R92.8

## 2015-07-09 HISTORY — PX: BREAST LUMPECTOMY WITH RADIOACTIVE SEED LOCALIZATION: SHX6424

## 2015-07-09 SURGERY — BREAST LUMPECTOMY WITH RADIOACTIVE SEED LOCALIZATION
Anesthesia: General | Site: Breast | Laterality: Left

## 2015-07-09 MED ORDER — MIDAZOLAM HCL 2 MG/2ML IJ SOLN
INTRAMUSCULAR | Status: AC
  Filled 2015-07-09: qty 2

## 2015-07-09 MED ORDER — PROMETHAZINE HCL 25 MG/ML IJ SOLN
INTRAMUSCULAR | Status: AC
  Filled 2015-07-09: qty 1

## 2015-07-09 MED ORDER — ONDANSETRON HCL 4 MG/2ML IJ SOLN: 4.0000 mg | Freq: Four times a day (QID) | INTRAMUSCULAR | Status: DC | PRN

## 2015-07-09 MED ORDER — FENTANYL CITRATE (PF) 100 MCG/2ML IJ SOLN: 25.0000 ug | INTRAMUSCULAR | Status: DC | PRN

## 2015-07-09 MED ORDER — CEFAZOLIN SODIUM-DEXTROSE 2-4 GM/100ML-% IV SOLN
2.0000 g | INTRAVENOUS | Status: AC
  Administered 2015-07-09: 2 g via INTRAVENOUS

## 2015-07-09 MED ORDER — HYDROCODONE-ACETAMINOPHEN 5-325 MG PO TABS: 1.0000 | ORAL_TABLET | Freq: Four times a day (QID) | ORAL | Status: DC | PRN

## 2015-07-09 MED ORDER — OXYCODONE HCL 5 MG PO TABS: 5.0000 mg | ORAL_TABLET | Freq: Once | ORAL | Status: DC | PRN

## 2015-07-09 MED ORDER — SODIUM CHLORIDE 0.9% FLUSH: 3.0000 mL | INTRAVENOUS | Status: DC | PRN

## 2015-07-09 MED ORDER — CHLORHEXIDINE GLUCONATE 4 % EX LIQD: 1.0000 "application " | Freq: Once | CUTANEOUS | Status: DC

## 2015-07-09 MED ORDER — SODIUM CHLORIDE 0.9% FLUSH: 3.0000 mL | Freq: Two times a day (BID) | INTRAVENOUS | Status: DC

## 2015-07-09 MED ORDER — BUPIVACAINE-EPINEPHRINE (PF) 0.5% -1:200000 IJ SOLN
INTRAMUSCULAR | Status: AC
  Filled 2015-07-09: qty 30

## 2015-07-09 MED ORDER — PROPOFOL 500 MG/50ML IV EMUL
INTRAVENOUS | Status: AC
  Filled 2015-07-09: qty 50

## 2015-07-09 MED ORDER — FENTANYL CITRATE (PF) 100 MCG/2ML IJ SOLN
INTRAMUSCULAR | Status: AC
  Filled 2015-07-09: qty 2

## 2015-07-09 MED ORDER — LACTATED RINGERS IV SOLN
INTRAVENOUS | Status: DC
  Administered 2015-07-09 (×2): via INTRAVENOUS

## 2015-07-09 MED ORDER — EPHEDRINE 5 MG/ML INJ
INTRAVENOUS | Status: AC
  Filled 2015-07-09: qty 10

## 2015-07-09 MED ORDER — GLYCOPYRROLATE 0.2 MG/ML IJ SOLN: 0.2000 mg | Freq: Once | INTRAMUSCULAR | Status: DC | PRN

## 2015-07-09 MED ORDER — ONDANSETRON HCL 4 MG/2ML IJ SOLN
INTRAMUSCULAR | Status: AC
  Filled 2015-07-09: qty 2

## 2015-07-09 MED ORDER — PROPOFOL 10 MG/ML IV BOLUS
INTRAVENOUS | Status: DC | PRN
  Administered 2015-07-09: 200 mg via INTRAVENOUS

## 2015-07-09 MED ORDER — MIDAZOLAM HCL 2 MG/2ML IJ SOLN
1.0000 mg | INTRAMUSCULAR | Status: DC | PRN
  Administered 2015-07-09: 2 mg via INTRAVENOUS

## 2015-07-09 MED ORDER — DEXAMETHASONE SODIUM PHOSPHATE 10 MG/ML IJ SOLN
INTRAMUSCULAR | Status: AC
  Filled 2015-07-09: qty 1

## 2015-07-09 MED ORDER — PROMETHAZINE HCL 25 MG/ML IJ SOLN
6.2500 mg | Freq: Once | INTRAMUSCULAR | Status: AC
  Administered 2015-07-09: 6.25 mg via INTRAVENOUS

## 2015-07-09 MED ORDER — ONDANSETRON HCL 4 MG/2ML IJ SOLN
INTRAMUSCULAR | Status: DC | PRN
  Administered 2015-07-09: 4 mg via INTRAVENOUS

## 2015-07-09 MED ORDER — ACETAMINOPHEN 325 MG PO TABS: 650.0000 mg | ORAL_TABLET | ORAL | Status: DC | PRN

## 2015-07-09 MED ORDER — CEFAZOLIN SODIUM-DEXTROSE 2-4 GM/100ML-% IV SOLN
INTRAVENOUS | Status: AC
  Filled 2015-07-09: qty 100

## 2015-07-09 MED ORDER — HYDROMORPHONE HCL 1 MG/ML IJ SOLN: 0.2500 mg | INTRAMUSCULAR | Status: DC | PRN

## 2015-07-09 MED ORDER — FENTANYL CITRATE (PF) 100 MCG/2ML IJ SOLN
50.0000 ug | INTRAMUSCULAR | Status: AC | PRN
  Administered 2015-07-09: 50 ug via INTRAVENOUS
  Administered 2015-07-09: 25 ug via INTRAVENOUS
  Administered 2015-07-09: 50 ug via INTRAVENOUS

## 2015-07-09 MED ORDER — SODIUM CHLORIDE 0.9 % IV SOLN: INTRAVENOUS | Status: DC

## 2015-07-09 MED ORDER — DEXAMETHASONE SODIUM PHOSPHATE 4 MG/ML IJ SOLN
INTRAMUSCULAR | Status: DC | PRN
  Administered 2015-07-09: 10 mg via INTRAVENOUS

## 2015-07-09 MED ORDER — ACETAMINOPHEN 650 MG RE SUPP: 650.0000 mg | RECTAL | Status: DC | PRN

## 2015-07-09 MED ORDER — SODIUM CHLORIDE 0.9 % IV SOLN: 250.0000 mL | INTRAVENOUS | Status: DC | PRN

## 2015-07-09 MED ORDER — LIDOCAINE 2% (20 MG/ML) 5 ML SYRINGE
INTRAMUSCULAR | Status: AC
  Filled 2015-07-09: qty 5

## 2015-07-09 MED ORDER — BUPIVACAINE-EPINEPHRINE (PF) 0.5% -1:200000 IJ SOLN
INTRAMUSCULAR | Status: DC | PRN
  Administered 2015-07-09: 10 mL via PERINEURAL

## 2015-07-09 MED ORDER — OXYCODONE HCL 5 MG PO TABS: 5.0000 mg | ORAL_TABLET | ORAL | Status: DC | PRN

## 2015-07-09 MED ORDER — OXYCODONE HCL 5 MG/5ML PO SOLN: 5.0000 mg | Freq: Once | ORAL | Status: DC | PRN

## 2015-07-09 MED ORDER — LIDOCAINE HCL (CARDIAC) 20 MG/ML IV SOLN
INTRAVENOUS | Status: DC | PRN
  Administered 2015-07-09: 60 mg via INTRAVENOUS

## 2015-07-09 MED ORDER — SCOPOLAMINE 1 MG/3DAYS TD PT72: 1.0000 | MEDICATED_PATCH | Freq: Once | TRANSDERMAL | Status: DC | PRN

## 2015-07-09 SURGICAL SUPPLY — 62 items
APPLIER CLIP 9.375 MED OPEN (MISCELLANEOUS)
BENZOIN TINCTURE PRP APPL 2/3 (GAUZE/BANDAGES/DRESSINGS) IMPLANT
BINDER BREAST LRG (GAUZE/BANDAGES/DRESSINGS) ×3 IMPLANT
BINDER BREAST MEDIUM (GAUZE/BANDAGES/DRESSINGS) IMPLANT
BINDER BREAST XLRG (GAUZE/BANDAGES/DRESSINGS) IMPLANT
BINDER BREAST XXLRG (GAUZE/BANDAGES/DRESSINGS) IMPLANT
BLADE HEX COATED 2.75 (ELECTRODE) ×3 IMPLANT
BLADE SURG 10 STRL SS (BLADE) IMPLANT
BLADE SURG 15 STRL LF DISP TIS (BLADE) ×1 IMPLANT
BLADE SURG 15 STRL SS (BLADE) ×2
CANISTER SUC SOCK COL 7IN (MISCELLANEOUS) IMPLANT
CANISTER SUCT 1200ML W/VALVE (MISCELLANEOUS) ×3 IMPLANT
CHLORAPREP W/TINT 26ML (MISCELLANEOUS) ×3 IMPLANT
CLIP APPLIE 9.375 MED OPEN (MISCELLANEOUS) IMPLANT
CLOSURE WOUND 1/2 X4 (GAUZE/BANDAGES/DRESSINGS)
COVER BACK TABLE 60X90IN (DRAPES) ×3 IMPLANT
COVER MAYO STAND STRL (DRAPES) ×3 IMPLANT
COVER PROBE W GEL 5X96 (DRAPES) ×3 IMPLANT
DECANTER SPIKE VIAL GLASS SM (MISCELLANEOUS) IMPLANT
DERMABOND ADVANCED (GAUZE/BANDAGES/DRESSINGS) ×2
DERMABOND ADVANCED .7 DNX12 (GAUZE/BANDAGES/DRESSINGS) ×1 IMPLANT
DEVICE DUBIN W/COMP PLATE 8390 (MISCELLANEOUS) ×3 IMPLANT
DRAPE LAPAROSCOPIC ABDOMINAL (DRAPES) ×3 IMPLANT
DRAPE UTILITY XL STRL (DRAPES) ×3 IMPLANT
DRSG PAD ABDOMINAL 8X10 ST (GAUZE/BANDAGES/DRESSINGS) IMPLANT
ELECT REM PT RETURN 9FT ADLT (ELECTROSURGICAL) ×3
ELECTRODE REM PT RTRN 9FT ADLT (ELECTROSURGICAL) ×1 IMPLANT
GLOVE BIO SURGEON STRL SZ7.5 (GLOVE) ×3 IMPLANT
GLOVE BIOGEL PI IND STRL 8 (GLOVE) ×1 IMPLANT
GLOVE BIOGEL PI INDICATOR 8 (GLOVE) ×2
GLOVE EUDERMIC 7 POWDERFREE (GLOVE) ×3 IMPLANT
GOWN STRL REUS W/ TWL LRG LVL3 (GOWN DISPOSABLE) ×1 IMPLANT
GOWN STRL REUS W/ TWL XL LVL3 (GOWN DISPOSABLE) ×2 IMPLANT
GOWN STRL REUS W/TWL LRG LVL3 (GOWN DISPOSABLE) ×2
GOWN STRL REUS W/TWL XL LVL3 (GOWN DISPOSABLE) ×4
ILLUMINATOR WAVEGUIDE N/F (MISCELLANEOUS) IMPLANT
KIT MARKER MARGIN INK (KITS) ×3 IMPLANT
LIGHT WAVEGUIDE WIDE FLAT (MISCELLANEOUS) IMPLANT
NEEDLE HYPO 25X1 1.5 SAFETY (NEEDLE) ×3 IMPLANT
NS IRRIG 1000ML POUR BTL (IV SOLUTION) ×3 IMPLANT
PACK BASIN DAY SURGERY FS (CUSTOM PROCEDURE TRAY) ×3 IMPLANT
PENCIL BUTTON HOLSTER BLD 10FT (ELECTRODE) ×3 IMPLANT
SHEET MEDIUM DRAPE 40X70 STRL (DRAPES) IMPLANT
SLEEVE SCD COMPRESS KNEE MED (MISCELLANEOUS) ×3 IMPLANT
SPONGE GAUZE 4X4 12PLY STER LF (GAUZE/BANDAGES/DRESSINGS) IMPLANT
SPONGE LAP 18X18 X RAY DECT (DISPOSABLE) IMPLANT
SPONGE LAP 4X18 X RAY DECT (DISPOSABLE) ×3 IMPLANT
STRIP CLOSURE SKIN 1/2X4 (GAUZE/BANDAGES/DRESSINGS) IMPLANT
SUT ETHILON 3 0 FSL (SUTURE) IMPLANT
SUT MNCRL AB 4-0 PS2 18 (SUTURE) ×3 IMPLANT
SUT SILK 2 0 SH (SUTURE) ×3 IMPLANT
SUT VIC AB 2-0 CT1 27 (SUTURE)
SUT VIC AB 2-0 CT1 TAPERPNT 27 (SUTURE) IMPLANT
SUT VIC AB 3-0 SH 27 (SUTURE)
SUT VIC AB 3-0 SH 27X BRD (SUTURE) IMPLANT
SUT VICRYL 3-0 CR8 SH (SUTURE) ×3 IMPLANT
SYRINGE 10CC LL (SYRINGE) ×3 IMPLANT
TOWEL OR 17X24 6PK STRL BLUE (TOWEL DISPOSABLE) ×3 IMPLANT
TOWEL OR NON WOVEN STRL DISP B (DISPOSABLE) IMPLANT
TUBE CONNECTING 20'X1/4 (TUBING) ×1
TUBE CONNECTING 20X1/4 (TUBING) ×2 IMPLANT
YANKAUER SUCT BULB TIP NO VENT (SUCTIONS) ×3 IMPLANT

## 2015-07-09 NOTE — Anesthesia Postprocedure Evaluation (Signed)
Anesthesia Post Note  Patient: [name]  Procedure(s) Performed: Procedure(s) (LRB): LEFT BREAST LUMPECTOMY WITH RADIOACTIVE SEED LOCALIZATION (Left)  Patient location during evaluation: PACU Anesthesia Type: General Level of consciousness: awake and alert and patient cooperative Pain management: pain level controlled Vital Signs Assessment: post-procedure vital signs reviewed and stable Respiratory status: spontaneous breathing and respiratory function stable Cardiovascular status: stable Anesthetic complications: no    Last Vitals:  Filed Vitals:   07/09/15 1100 07/09/15 1124  BP: 121/62 126/65  Pulse: 87 83  Temp:  36.7 C  Resp: 13 16    Last Pain:  Filed Vitals:   07/09/15 1127  PainSc: 0-No pain                 Notnamed Croucher S

## 2015-07-09 NOTE — Anesthesia Preprocedure Evaluation (Addendum)
Anesthesia Evaluation  Patient identified by MRN, date of birth, ID band Patient awake    Reviewed: Allergy & Precautions, H&P , NPO status , Patient's Chart, lab work & pertinent test results  Airway Mallampati: II   Neck ROM: full    Dental   Pulmonary former smoker,    breath sounds clear to auscultation       Cardiovascular negative cardio ROS   Rhythm:regular Rate:Normal     Neuro/Psych PSYCHIATRIC DISORDERS Anxiety    GI/Hepatic   Endo/Other  obese  Renal/GU      Musculoskeletal  (+) Arthritis ,   Abdominal   Peds  Hematology   Anesthesia Other Findings   Reproductive/Obstetrics                            Anesthesia Physical Anesthesia Plan  ASA: II  Anesthesia Plan: General   Post-op Pain Management:    Induction: Intravenous  Airway Management Planned: LMA  Additional Equipment:   Intra-op Plan:   Post-operative Plan:   Informed Consent: I have reviewed the patients History and Physical, chart, labs and discussed the procedure including the risks, benefits and alternatives for the proposed anesthesia with the patient or authorized representative who has indicated his/her understanding and acceptance.     Plan Discussed with: CRNA, Anesthesiologist and Surgeon  Anesthesia Plan Comments:         Anesthesia Quick Evaluation

## 2015-07-09 NOTE — Discharge Instructions (Signed)
[facility] °Office Phone Number [phone] ° °BREAST BIOPSY/ PARTIAL MASTECTOMY: POST OP INSTRUCTIONS ° °Always review your discharge instruction sheet given to you by the facility where your surgery was performed. ° °IF YOU HAVE DISABILITY OR FAMILY LEAVE FORMS, YOU MUST BRING THEM TO THE OFFICE FOR PROCESSING.  DO NOT GIVE THEM TO YOUR DOCTOR. ° °1. A prescription for pain medication may be given to you upon discharge.  Take your pain medication as prescribed, if needed.  If narcotic pain medicine is not needed, then you may take acetaminophen (Tylenol) or ibuprofen (Advil) as needed. °2. Take your usually prescribed medications unless otherwise directed °3. If you need a refill on your pain medication, please contact your pharmacy.  They will contact our office to request authorization.  Prescriptions will not be filled after 5pm or on week-ends. °4. You should eat very light the first 24 hours after surgery, such as soup, crackers, pudding, etc.  Resume your normal diet the day after surgery. °5. Most patients will experience some swelling and bruising in the breast.  Ice packs and a good support bra will help.  Swelling and bruising can take several days to resolve.  °6. It is common to experience some constipation if taking pain medication after surgery.  Increasing fluid intake and taking a stool softener will usually help or prevent this problem from occurring.  A mild laxative (Milk of Magnesia or Miralax) should be taken according to package directions if there are no bowel movements after 48 hours. °7. Unless discharge instructions indicate otherwise, you may remove your bandages 24-48 hours after surgery, and you may shower at that time.  You may have steri-strips (small skin tapes) in place directly over the incision.  These strips should be left on the skin for 7-10 days.  If your surgeon used skin glue on the incision, you may shower in 24 hours.  The glue will flake off over the  next 2-3 weeks.  Any sutures or staples will be removed at the office during your follow-up visit. °8. ACTIVITIES:  You may resume regular daily activities (gradually increasing) beginning the next day.  Wearing a good support bra or sports bra minimizes pain and swelling.  You may have sexual intercourse when it is comfortable. °a. You may drive when you no longer are taking prescription pain medication, you can comfortably wear a seatbelt, and you can safely maneuver your car and apply brakes. °b. RETURN TO WORK:  ______________________________________________________________________________________ °9. You should see your doctor in the office for a follow-up appointment approximately two weeks after your surgery.  Your doctor’s nurse will typically make your follow-up appointment when she calls you with your pathology report.  Expect your pathology report 2-3 business days after your surgery.  You may call to check if you do not hear from us after three days. °10. OTHER INSTRUCTIONS: _______________________________________________________________________________________________ _____________________________________________________________________________________________________________________________________ °_____________________________________________________________________________________________________________________________________ °_____________________________________________________________________________________________________________________________________ ° °WHEN TO CALL YOUR DOCTOR: °1. Fever over 101.0 °2. Nausea and/or vomiting. °3. Extreme swelling or bruising. °4. Continued bleeding from incision. °5. Increased pain, redness, or drainage from the incision. ° °The clinic staff is available to answer your questions during regular business hours.  Please don’t hesitate to call and ask to speak to one of the nurses for clinical concerns.  If you have a medical emergency, go to the nearest  emergency room or call 911.  A surgeon from [facility] is always on call at the hospital. ° °For further questions, please visit [email or web address]  ° ° ° °  Post Anesthesia Home Care Instructions ° °Activity: °Get plenty of rest for the remainder of the day. A responsible adult should stay with you for 24 hours following the procedure.  °For the next 24 hours, DO NOT: °-Drive a car °-Operate machinery °-Drink alcoholic beverages °-Take any medication unless instructed by your physician °-Make any legal decisions or sign important papers. ° °Meals: °Start with liquid foods such as gelatin or soup. Progress to regular foods as tolerated. Avoid greasy, spicy, heavy foods. If nausea and/or vomiting occur, drink only clear liquids until the nausea and/or vomiting subsides. Call your physician if vomiting continues. ° °Special Instructions/Symptoms: °Your throat may feel dry or sore from the anesthesia or the breathing tube placed in your throat during surgery. If this causes discomfort, gargle with warm salt water. The discomfort should disappear within 24 hours. ° °If you had a scopolamine patch placed behind your ear for the management of post- operative nausea and/or vomiting: ° °1. The medication in the patch is effective for 72 hours, after which it should be removed.  Wrap patch in a tissue and discard in the trash. Wash hands thoroughly with soap and water. °2. You may remove the patch earlier than 72 hours if you experience unpleasant side effects which may include dry mouth, dizziness or visual disturbances. °3. Avoid touching the patch. Wash your hands with soap and water after contact with the patch. °  ° °

## 2015-07-09 NOTE — Op Note (Signed)
  Patient Name:           [name]   Date of Surgery:        07/09/2015  Pre op Diagnosis:      Abnormal mammogram left breast  Post op Diagnosis:    Abnormal mammogram left breast  Procedure:                 Left breast lumpectomy with radioactive seed localization and margin assessment  Surgeon:                     Edsel Petrin. Dalbert Batman, M.D., FACS  Assistant:                      OR staff  Operative Indications:    This is a very pleasant 60 year old female from [facility]. She is referred by Dr. Curlene Dolphin at the [facility] because of a possible hemangioma of the left breast, 11:30 position. Mallory Shirk in [place] is her PCP.      She has no prior history of breast problems. no discharge. Recent screening mammogram showed a 5 mm mass in the left breast at the 11:30 position 6 cm from the nipple. Image guided biopsy was performed. This was read by Dr. Claudette Laws and with second opinion by Dr. Gari Crown and they think this is a hemangioma but they recommended excision. She was therefore referred. She doesn't want to take any chance. She knows this is a low probability of cancer. She would like this area excised.      Family history reveals paternal aunt with breast cancer. Paternal grandmother had uterine cancer. Father and brother had colon cancer.  Operative Findings:       Radioactive seed was placed at the breast center of [place] 3 days ago.  Imaging studies showed good localization.  Using the neoprobe I could hear the audible signal of the radioactive seed in the holding area.  Specimen mammogram looked good with radioactive seed in the original biopsy clip in the near center of the specimen.  Procedure in Detail:          Following the induction of general LMA anesthesia the patient's left breast was prepped and draped in a sterile fashion, surgical timeout was performed, intravenous antibiotics were given, and 0.5% Marcaine with epinephrine was used as local  infiltration anesthetic.  Using the neoprobe identified the area of maximum radioactivity high in the left breast.  This was actually at about the 12:30 position.  This was felt to be in a relatively superficial location.  Chose to make a narrow elliptical incision of the skin to avoid disrupting the seed.  Dissection was carried down into the breast tissue around the radioactivity.  The specimen was removed.  This was marked with silk sutures and a 6 color ink kit to orient the pathologist.  Specimen mammogram looked very good as described above.  The specimen was marked and sent to the lab.  The wound was irrigated with saline.  Hemostasis was excellent and achieved electrocautery.  The breast tissues were reapproximated with 3-0 Vicryl sutures and the skin closed with a running subcuticular 4-0 Monocryl and Dermabond.  Patient tolerated the procedure well  & was taken to PACU in stable condition.  EBL 5-10 mL.  Counts correct.  Complications none.     Edsel Petrin. Dalbert Batman, M.D., FACS General and Minimally Invasive Surgery Breast and Colorectal Surgery  07/09/2015 10:08 AM

## 2015-07-09 NOTE — Interval H&P Note (Signed)
History and Physical Interval Note:  07/09/2015 8:49 AM  [name]  has presented today for surgery, with the diagnosis of abnormal mammogram left breast  The various methods of treatment have been discussed with the patient and family. After consideration of risks, benefits and other options for treatment, the patient has consented to  Procedure(s): LEFT BREAST LUMPECTOMY WITH RADIOACTIVE SEED LOCALIZATION (Left) as a surgical intervention .  The patient's history has been reviewed, patient examined, no change in status, stable for surgery.  I have reviewed the patient's chart and labs.  Questions were answered to the patient's satisfaction.     Adin Hector

## 2015-07-09 NOTE — Anesthesia Procedure Notes (Signed)
Procedure Name: LMA Insertion Date/Time: 07/09/2015 9:31 AM Performed by: Jahaad Penado D Pre-anesthesia Checklist: Patient identified, Emergency Drugs available, Suction available and Patient being monitored Patient Re-evaluated:Patient Re-evaluated prior to inductionOxygen Delivery Method: Circle System Utilized Preoxygenation: Pre-oxygenation with 100% oxygen Intubation Type: IV induction Ventilation: Mask ventilation without difficulty LMA: LMA inserted LMA Size: 4.0 Number of attempts: 1 Airway Equipment and Method: Bite block Placement Confirmation: positive ETCO2 Tube secured with: Tape Dental Injury: Teeth and Oropharynx as per pre-operative assessment

## 2015-07-09 NOTE — Transfer of Care (Signed)
Immediate Anesthesia Transfer of Care Note  Patient: [name]  Procedure(s) Performed: Procedure(s): LEFT BREAST LUMPECTOMY WITH RADIOACTIVE SEED LOCALIZATION (Left)  Patient Location: PACU  Anesthesia Type:General  Level of Consciousness: awake, alert , oriented and patient cooperative  Airway & Oxygen Therapy: Patient Spontanous Breathing and Patient connected to face mask oxygen  Post-op Assessment: Report given to RN and Post -op Vital signs reviewed and stable  Post vital signs: Reviewed and stable  Last Vitals:  Filed Vitals:   07/09/15 0832 07/09/15 1014  BP: 111/66   Pulse: 65 84  Temp: 36.6 C   Resp: 20     Last Pain: There were no vitals filed for this visit.       Complications: No apparent anesthesia complications

## 2015-07-10 ENCOUNTER — Encounter (HOSPITAL_BASED_OUTPATIENT_CLINIC_OR_DEPARTMENT_OTHER): Payer: Self-pay | Admitting: General Surgery

## 2015-07-10 NOTE — Progress Notes (Signed)
Quick Note:  Inform patient of Pathology report,.tell her that her left breast biopsy shows a benign hemangioma. This is good news. Nothing further needs to be done. I will discuss in detail with her at her postop office visit.  hmi ______

## 2015-07-25 DIAGNOSIS — M1612 Unilateral primary osteoarthritis, left hip: Secondary | ICD-10-CM | POA: Diagnosis not present

## 2015-07-30 DIAGNOSIS — J4541 Moderate persistent asthma with (acute) exacerbation: Secondary | ICD-10-CM | POA: Diagnosis not present

## 2015-07-30 DIAGNOSIS — E782 Mixed hyperlipidemia: Secondary | ICD-10-CM | POA: Diagnosis not present

## 2015-07-31 DIAGNOSIS — Z961 Presence of intraocular lens: Secondary | ICD-10-CM | POA: Diagnosis not present

## 2015-08-09 DIAGNOSIS — M1612 Unilateral primary osteoarthritis, left hip: Secondary | ICD-10-CM | POA: Diagnosis not present

## 2015-08-14 DIAGNOSIS — M47812 Spondylosis without myelopathy or radiculopathy, cervical region: Secondary | ICD-10-CM | POA: Diagnosis not present

## 2015-08-14 DIAGNOSIS — Z981 Arthrodesis status: Secondary | ICD-10-CM | POA: Diagnosis not present

## 2015-08-14 DIAGNOSIS — I700 Atherosclerosis of aorta: Secondary | ICD-10-CM | POA: Diagnosis not present

## 2015-08-14 DIAGNOSIS — M545 Low back pain: Secondary | ICD-10-CM | POA: Diagnosis not present

## 2015-09-25 DIAGNOSIS — M545 Low back pain: Secondary | ICD-10-CM | POA: Diagnosis not present

## 2015-09-25 DIAGNOSIS — M25559 Pain in unspecified hip: Secondary | ICD-10-CM | POA: Diagnosis not present

## 2015-09-25 DIAGNOSIS — G894 Chronic pain syndrome: Secondary | ICD-10-CM | POA: Diagnosis not present

## 2015-09-25 DIAGNOSIS — Z79891 Long term (current) use of opiate analgesic: Secondary | ICD-10-CM | POA: Diagnosis not present

## 2015-09-25 DIAGNOSIS — Z79899 Other long term (current) drug therapy: Secondary | ICD-10-CM | POA: Diagnosis not present

## 2015-09-26 DIAGNOSIS — E784 Other hyperlipidemia: Secondary | ICD-10-CM | POA: Diagnosis not present

## 2015-09-26 DIAGNOSIS — J4541 Moderate persistent asthma with (acute) exacerbation: Secondary | ICD-10-CM | POA: Diagnosis not present

## 2015-09-26 DIAGNOSIS — J441 Chronic obstructive pulmonary disease with (acute) exacerbation: Secondary | ICD-10-CM | POA: Diagnosis not present

## 2015-09-28 DIAGNOSIS — E782 Mixed hyperlipidemia: Secondary | ICD-10-CM | POA: Diagnosis not present

## 2015-09-28 DIAGNOSIS — L570 Actinic keratosis: Secondary | ICD-10-CM | POA: Diagnosis not present

## 2015-09-28 DIAGNOSIS — J4541 Moderate persistent asthma with (acute) exacerbation: Secondary | ICD-10-CM | POA: Diagnosis not present

## 2015-10-23 DIAGNOSIS — G894 Chronic pain syndrome: Secondary | ICD-10-CM | POA: Diagnosis not present

## 2015-10-23 DIAGNOSIS — M25559 Pain in unspecified hip: Secondary | ICD-10-CM | POA: Diagnosis not present

## 2015-10-23 DIAGNOSIS — Z79899 Other long term (current) drug therapy: Secondary | ICD-10-CM | POA: Diagnosis not present

## 2015-10-23 DIAGNOSIS — M545 Low back pain: Secondary | ICD-10-CM | POA: Diagnosis not present

## 2015-10-23 DIAGNOSIS — Z79891 Long term (current) use of opiate analgesic: Secondary | ICD-10-CM | POA: Diagnosis not present

## 2015-11-22 ENCOUNTER — Ambulatory Visit: Payer: Self-pay | Admitting: Orthopedic Surgery

## 2015-11-29 DIAGNOSIS — Z79891 Long term (current) use of opiate analgesic: Secondary | ICD-10-CM | POA: Diagnosis not present

## 2015-11-29 DIAGNOSIS — M961 Postlaminectomy syndrome, not elsewhere classified: Secondary | ICD-10-CM | POA: Diagnosis not present

## 2015-11-29 DIAGNOSIS — Z79899 Other long term (current) drug therapy: Secondary | ICD-10-CM | POA: Diagnosis not present

## 2015-11-29 DIAGNOSIS — G894 Chronic pain syndrome: Secondary | ICD-10-CM | POA: Diagnosis not present

## 2015-11-29 DIAGNOSIS — M5137 Other intervertebral disc degeneration, lumbosacral region: Secondary | ICD-10-CM | POA: Diagnosis not present

## 2015-11-29 DIAGNOSIS — M47817 Spondylosis without myelopathy or radiculopathy, lumbosacral region: Secondary | ICD-10-CM | POA: Diagnosis not present

## 2015-11-30 ENCOUNTER — Encounter (HOSPITAL_COMMUNITY): Payer: Self-pay

## 2015-12-05 NOTE — Patient Instructions (Addendum)
Theodocia Drinkard Virgen  12/05/2015   Your procedure is scheduled on: 12/12/15  Report to [facility] Main  Entrance take [place]  elevators to 3rd floor to  [place] at [place]  AM.  Call this number if you have problems the morning of surgery [phone]   Remember: ONLY 1 PERSON MAY GO WITH YOU TO SHORT STAY TO GET  READY MORNING OF [place].  Do not eat food or drink liquids :After Midnight.     Take these medicines the morning of surgery with A SIP OF WATER: Singulair Oxycodone ER May take Oxycodone if needed, use Pro Air if needed                                You may not have any metal on your body including hair pins and              piercings  Do not wear jewelry, make-up, lotions, powders or perfumes, deodorant             Do not wear nail polish.  Do not shave  48 hours prior to surgery.              Men may shave face and neck.   Do not bring valuables to the hospital. [place].  Contacts, dentures or bridgework may not be worn into surgery.  Leave suitcase in the car. After surgery it may be brought to your room.               [place] - Preparing for Surgery Before surgery, you can play an important role.  Because skin is not sterile, your skin needs to be as free of germs as possible.  You can reduce the number of germs on your skin by washing with CHG (chlorahexidine gluconate) soap before surgery.  CHG is an antiseptic cleaner which kills germs and bonds with the skin to continue killing germs even after washing. Please DO NOT use if you have an allergy to CHG or antibacterial soaps.  If your skin becomes reddened/irritated stop using the CHG and inform your nurse when you arrive at Short Stay. Do not shave (including legs and underarms) for at least 48 hours prior to the first CHG shower.  You may shave your face/neck. Please follow these instructions carefully:  1.  Shower with CHG Soap  the night before surgery and the  morning of Surgery.  2.  If you choose to wash your hair, wash your hair first as usual with your  normal  shampoo.  3.  After you shampoo, rinse your hair and body thoroughly to remove the  shampoo.                           4.  Use CHG as you would any other liquid soap.  You can apply chg directly  to the skin and wash                       Gently with a scrungie or clean washcloth.  5.  Apply the CHG Soap to your body ONLY FROM THE NECK DOWN.   Do not use  on face/ open                           Wound or open sores. Avoid contact with eyes, ears mouth and genitals (private parts).                       Wash face,  Genitals (private parts) with your normal soap.             6.  Wash thoroughly, paying special attention to the area where your surgery  will be performed.  7.  Thoroughly rinse your body with warm water from the neck down.  8.  DO NOT shower/wash with your normal soap after using and rinsing off  the CHG Soap.                9.  Pat yourself dry with a clean towel.            10.  Wear clean pajamas.            11.  Place clean sheets on your bed the night of your first shower and do not  sleep with pets. Day of Surgery : Do not apply any lotions/deodorants the morning of surgery.  Please wear clean clothes to the hospital/surgery center.  FAILURE TO FOLLOW THESE INSTRUCTIONS MAY RESULT IN THE CANCELLATION OF YOUR SURGERY PATIENT SIGNATURE_________________________________  NURSE SIGNATURE__________________________________  ________________________________________________________________________   [name]  An incentive spirometer is a tool that can help keep your lungs clear and active. This tool measures how well you are filling your lungs with each breath. Taking long deep breaths may help reverse or decrease the chance of developing breathing (pulmonary) problems (especially infection) following:  A long period of time when  you are unable to move or be active. BEFORE THE PROCEDURE   If the spirometer includes an indicator to show your best effort, your nurse or respiratory therapist will set it to a desired goal.  If possible, sit up straight or lean slightly forward. Try not to slouch.  Hold the incentive spirometer in an upright position. INSTRUCTIONS FOR USE  1. Sit on the edge of your bed if possible, or sit up as far as you can in bed or on a chair. 2. Hold the incentive spirometer in an upright position. 3. Breathe out normally. 4. Place the mouthpiece in your mouth and seal your lips tightly around it. 5. Breathe in slowly and as deeply as possible, raising the piston or the ball toward the top of the column. 6. Hold your breath for 3-5 seconds or for as long as possible. Allow the piston or ball to fall to the bottom of the column. 7. Remove the mouthpiece from your mouth and breathe out normally. 8. Rest for a few seconds and repeat Steps 1 through 7 at least 10 times every 1-2 hours when you are awake. Take your time and take a few normal breaths between deep breaths. 9. The spirometer may include an indicator to show your best effort. Use the indicator as a goal to work toward during each repetition. 10. After each set of 10 deep breaths, practice coughing to be sure your lungs are clear. If you have an incision (the cut made at the time of surgery), support your incision when coughing by placing a pillow or rolled up towels firmly against it. Once you are able to get out of bed, walk around  indoors and cough well. You may stop using the incentive spirometer when instructed by your caregiver.  RISKS AND COMPLICATIONS  Take your time so you do not get dizzy or light-headed.  If you are in pain, you may need to take or ask for pain medication before doing incentive spirometry. It is harder to take a deep breath if you are having pain. AFTER USE  Rest and breathe slowly and easily.  It can be  helpful to keep track of a log of your progress. Your caregiver can provide you with a simple table to help with this. If you are using the spirometer at home, follow these instructions: [place] IF:   You are having difficultly using the spirometer.  You have trouble using the spirometer as often as instructed.  Your pain medication is not giving enough relief while using the spirometer.  You develop fever of 100.5 F (38.1 C) or higher. SEEK IMMEDIATE MEDICAL CARE IF:   You cough up bloody sputum that had not been present before.  You develop fever of 102 F (38.9 C) or greater.  You develop worsening pain at or near the incision site. MAKE SURE YOU:   Understand these instructions.  Will watch your condition.  Will get help right away if you are not doing well or get worse. Document Released: 06/23/2006 Document Revised: 05/05/2011 Document Reviewed: 08/24/2006 Exit[employer] Patient Information 2014 [employer], [place].   ________________________________________________________________________  WHAT IS A BLOOD TRANSFUSION? Blood Transfusion Information  A transfusion is the replacement of blood or some of its parts. Blood is made up of multiple cells which provide different functions.  Red blood cells carry oxygen and are used for blood loss replacement.  White blood cells fight against infection.  Platelets control bleeding.  Plasma helps clot blood.  Other blood products are available for specialized needs, such as hemophilia or other clotting disorders. BEFORE THE TRANSFUSION  Who gives blood for transfusions?   Healthy volunteers who are fully evaluated to make sure their blood is safe. This is blood bank blood. Transfusion therapy is the safest it has ever been in the practice of medicine. Before blood is taken from a donor, a complete history is taken to make sure that person has no history of diseases nor engages in risky social behavior (examples are  intravenous drug use or sexual activity with multiple partners). The donor's travel history is screened to minimize risk of transmitting infections, such as malaria. The donated blood is tested for signs of infectious diseases, such as HIV and hepatitis. The blood is then tested to be sure it is compatible with you in order to minimize the chance of a transfusion reaction. If you or a relative donates blood, this is often done in anticipation of surgery and is not appropriate for emergency situations. It takes many days to process the donated blood. RISKS AND COMPLICATIONS Although transfusion therapy is very safe and saves many lives, the main dangers of transfusion include:   Getting an infectious disease.  Developing a transfusion reaction. This is an allergic reaction to something in the blood you were given. Every precaution is taken to prevent this. The decision to have a blood transfusion has been considered carefully by your caregiver before blood is given. Blood is not given unless the benefits outweigh the risks. AFTER THE TRANSFUSION  Right after receiving a blood transfusion, you will usually feel much better and more energetic. This is especially true if your red blood cells have gotten  low (anemic). The transfusion raises the level of the red blood cells which carry oxygen, and this usually causes an energy increase.  The nurse administering the transfusion will monitor you carefully for complications. HOME CARE INSTRUCTIONS  No special instructions are needed after a transfusion. You may find your energy is better. Speak with your caregiver about any limitations on activity for underlying diseases you may have. SEEK MEDICAL CARE IF:   Your condition is not improving after your transfusion.  You develop redness or irritation at the intravenous (IV) site. SEEK IMMEDIATE MEDICAL CARE IF:  Any of the following symptoms occur over the next 12 hours:  Shaking chills.  You have a  temperature by mouth above 102 F (38.9 C), not controlled by medicine.  Chest, back, or muscle pain.  People around you feel you are not acting correctly or are confused.  Shortness of breath or difficulty breathing.  Dizziness and fainting.  You get a rash or develop hives.  You have a decrease in urine output.  Your urine turns a dark color or changes to pink, red, or brown. Any of the following symptoms occur over the next 10 days:  You have a temperature by mouth above 102 F (38.9 C), not controlled by medicine.  Shortness of breath.  Weakness after normal activity.  The white part of the eye turns yellow (jaundice).  You have a decrease in the amount of urine or are urinating less often.  Your urine turns a dark color or changes to pink, red, or brown. Document Released: 02/08/2000 Document Revised: 05/05/2011 Document Reviewed: 09/27/2007 [facility] Patient Information 2014 [place], [place].  _______________________________________________________________________

## 2015-12-06 ENCOUNTER — Inpatient Hospital Stay (HOSPITAL_COMMUNITY): Admission: RE | Admit: 2015-12-06 | Payer: Medicare Other | Source: Ambulatory Visit

## 2015-12-07 ENCOUNTER — Encounter (HOSPITAL_COMMUNITY): Payer: Self-pay

## 2015-12-07 ENCOUNTER — Encounter (HOSPITAL_COMMUNITY)
Admission: RE | Admit: 2015-12-07 | Discharge: 2015-12-07 | Disposition: A | Discharge status: Home / Self Care | Payer: Medicare Other | Source: Ambulatory Visit | Attending: Orthopedic Surgery | Admitting: Orthopedic Surgery

## 2015-12-07 DIAGNOSIS — Z0183 Encounter for blood typing: Secondary | ICD-10-CM | POA: Insufficient documentation

## 2015-12-07 DIAGNOSIS — E7800 Pure hypercholesterolemia, unspecified: Secondary | ICD-10-CM | POA: Diagnosis not present

## 2015-12-07 DIAGNOSIS — Z79899 Other long term (current) drug therapy: Secondary | ICD-10-CM | POA: Diagnosis not present

## 2015-12-07 DIAGNOSIS — M87052 Idiopathic aseptic necrosis of left femur: Secondary | ICD-10-CM | POA: Diagnosis not present

## 2015-12-07 DIAGNOSIS — Z87891 Personal history of nicotine dependence: Secondary | ICD-10-CM | POA: Insufficient documentation

## 2015-12-07 DIAGNOSIS — M1612 Unilateral primary osteoarthritis, left hip: Secondary | ICD-10-CM | POA: Insufficient documentation

## 2015-12-07 DIAGNOSIS — Z01812 Encounter for preprocedural laboratory examination: Secondary | ICD-10-CM | POA: Diagnosis not present

## 2015-12-07 HISTORY — DX: Major depressive disorder, single episode, unspecified: F32.9

## 2015-12-07 HISTORY — DX: Bipolar disorder, unspecified: F31.9

## 2015-12-07 HISTORY — DX: Inflammatory liver disease, unspecified: K75.9

## 2015-12-07 HISTORY — DX: Unspecified asthma, uncomplicated: J45.909

## 2015-12-07 HISTORY — DX: Depression, unspecified: F32.A

## 2015-12-07 LAB — CBC
HEMATOCRIT: 43.9 % (ref 36.0–46.0)
HEMOGLOBIN: 14.5 g/dL (ref 12.0–15.0)
MCH: 28.6 pg (ref 26.0–34.0)
MCHC: 33 g/dL (ref 30.0–36.0)
MCV: 86.6 fL (ref 78.0–100.0)
Platelets: 54 10*3/uL — ABNORMAL LOW (ref 150–400)
RBC: 5.07 MIL/uL (ref 3.87–5.11)
RDW: 13 % (ref 11.5–15.5)
WBC: 9.2 10*3/uL (ref 4.0–10.5)

## 2015-12-07 LAB — URINALYSIS, ROUTINE W REFLEX MICROSCOPIC
BILIRUBIN URINE: NEGATIVE
Glucose, UA: NEGATIVE mg/dL
HGB URINE DIPSTICK: NEGATIVE
KETONES UR: NEGATIVE mg/dL
Leukocytes, UA: NEGATIVE
NITRITE: NEGATIVE
PROTEIN: NEGATIVE mg/dL
Specific Gravity, Urine: 1.017 (ref 1.005–1.030)
pH: 6 (ref 5.0–8.0)

## 2015-12-07 LAB — COMPREHENSIVE METABOLIC PANEL
ALBUMIN: 4 g/dL (ref 3.5–5.0)
ALT: 14 U/L (ref 14–54)
ANION GAP: 6 (ref 5–15)
AST: 16 U/L (ref 15–41)
Alkaline Phosphatase: 77 U/L (ref 38–126)
BILIRUBIN TOTAL: 0.8 mg/dL (ref 0.3–1.2)
BUN: 13 mg/dL (ref 6–20)
CO2: 27 mmol/L (ref 22–32)
Calcium: 8.9 mg/dL (ref 8.9–10.3)
Chloride: 105 mmol/L (ref 101–111)
Creatinine, Ser: 1.09 mg/dL — ABNORMAL HIGH (ref 0.44–1.00)
GFR calc Af Amer: >60 mL/min (ref >60)
GFR calc non Af Amer: 55 mL/min — ABNORMAL LOW (ref >60)
GLUCOSE: 93 mg/dL (ref 65–99)
POTASSIUM: 4.4 mmol/L (ref 3.5–5.1)
SODIUM: 138 mmol/L (ref 135–145)
Total Protein: 6.8 g/dL (ref 6.5–8.1)

## 2015-12-07 LAB — PROTIME-INR
INR: 0.92
Prothrombin Time: 12.4 seconds (ref 11.4–15.2)

## 2015-12-07 LAB — SURGICAL PCR SCREEN
MRSA, PCR: NEGATIVE
STAPHYLOCOCCUS AUREUS: NEGATIVE

## 2015-12-07 LAB — APTT: APTT: 27 s (ref 24–36)

## 2015-12-07 LAB — ABO/RH: ABO/RH(D): A NEG

## 2015-12-07 NOTE — Progress Notes (Signed)
CLEARANCE DR Cherly Anderson  on chart

## 2015-12-10 ENCOUNTER — Ambulatory Visit: Payer: Self-pay | Admitting: Orthopedic Surgery

## 2015-12-10 NOTE — H&P (Signed)
[name] DOB: September 12, 1955 Divorced / Language: English / Race: White Female Date of Admission:  12/12/2015 CC:  Left Hip Pain History of Present Illness  The patient is a 60 year old female who comes in for a preoperative History and Physical. The patient is scheduled for a left total hip arthroplasty (anterior) to be performed by Dr. Dione Plover. Aluisio, MD at [facility] on 12/12/2015. The patient is being followed for their left chronic hip pain. Current treatment includes: pain medications (Oxycodone, for all over problems). She had a MRI of the left hip preformed. Thers was concerned she had avascular necrosis, a possible small area of collapse of the femoral head. Her hip pain is progressively worsening. It is limiting her activities. It was found that she did have a large area of avascular necrosis, but also an area of collapse within this. There is some edema around that area. She has got avascular necrosis. At this point, the most predictable means of improving pain and function is total hip arthroplasty. The procedure, risks, potential complications and rehab course are discussed in detail and the patient elects to proceed. The goals of this procedure are decreased pain and increased function. We discussed the implications of that. She is familiar with it as she had hip replacement on the right in the past. We discussed that the most predictable means of improving her pain and function is going to be total hip arthroplasty. She is ready to proceed with surgery at this time. They have been treated conservatively in the past for the above stated problem and despite conservative measures, they continue to have progressive pain and severe functional limitations and dysfunction. They have failed non-operative management including home exercise, medications. It is felt that they would benefit from undergoing total joint replacement. Risks and benefits of the procedure have been discussed  with the patient and they elect to proceed with surgery. There are no active contraindications to surgery such as ongoing infection or rapidly progressive neurological disease.  Problem List/Past Medical  Primary osteoarthritis of left hip (M16.12)  Avascular necrosis of bone of left hip (M87.052)  Hypercholesterolemia  Ulcer disease  Asthma  Allergies  No Known Drug Allergies  Family History Cancer  Brother, Paternal Grandmother. Drug / Alcohol Addiction  Brother. Rheumatoid Arthritis  Maternal Grandfather.  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 06/28/2015 Under pain contract   Medication History  OxyCODONE HCl (10MG  Tablet, Oral) Active. Montelukast Sodium (10MG  Tablet, Oral) Active. HydrOXYzine Pamoate (25MG  Capsule, Oral) Active. Furosemide (20MG  Tablet, Oral) Active. Super B Complex (Oral) Active. Vitamin D3 (2000UNIT Tablet, Oral) Active. Calcium + D (150-261-330MG -MG-IU Capsule, Oral) Active.  Past Surgical History Cataract Surgery  bilateral Colon Polyp Removal - Colonoscopy  Gallbladder Surgery  open Hip Fracture and Surgery  right Spinal Fusion  lower back; Dr. Arnoldo Morale L2-3-4 Spinal Surgery  Tonsillectomy  Pilonidal Cyst Removal  Tubal Ligation   Review of Systems  General Not Present- Chills, Fatigue, Fever, Memory Loss, Night Sweats, Weight Gain and Weight Loss. Skin Not Present- Eczema, Hives, Itching, Lesions and Rash. HEENT Not Present- Dentures, Double Vision, Headache, Hearing Loss,  Tinnitus and Visual Loss. Respiratory Not Present-  Allergies, Chronic Cough, Coughing up blood, Shortness of breath at rest and Shortness of breath with exertion. Cardiovascular Not Present- Chest Pain, Difficulty Breathing Lying Down, Murmur, Palpitations, Racing/skipping heartbeats and Swelling. Gastrointestinal Not Present- Abdominal Pain, Bloody Stool, Constipation, Diarrhea, Difficulty Swallowing, Heartburn, Jaundice, Loss of appetitie, Nausea and Vomiting. Female Genitourinary Not Present- Blood in Urine, Discharge, Flank Pain, Incontinence, Painful Urination, Urgency, Urinary frequency, Urinary Retention, Urinating at Night and Weak urinary stream. Musculoskeletal Not Present- Back Pain, Joint Pain, Joint Swelling, Morning Stiffness, Muscle Pain, Muscle Weakness and Spasms. Neurological Not Present- Blackout spells, Difficulty with balance, Dizziness, Paralysis, Tremor and Weakness. Psychiatric Not Present- Insomnia.  Vitals  Weight: 195 lb Height: 67in Weight was reported by patient. Height was reported by patient. Body Surface Area: 2 m Body Mass Index: 30.54 kg/m  Pulse: 84 (Regular)  BP: 146/80 (Sitting, Right Arm, Standard)  Physical Exam General Mental Status -Alert, cooperative and good historian. General Appearance-pleasant, Not in acute distress. Orientation-Oriented X3. Build & Nutrition-Well nourished and Well developed.  Head and Neck Head-normocephalic, atraumatic . Neck Global Assessment - supple, no bruit auscultated on the right, no bruit auscultated on the left.  Eye Vision-Wears corrective lenses. Pupil - Bilateral-Irregular and Regular. Motion - Bilateral-EOMI.  Chest and Lung Exam Auscultation Breath sounds - clear at anterior chest wall and clear at posterior chest wall. Adventitious sounds - No Adventitious sounds.  Cardiovascular Auscultation Rhythm - Regular rate and rhythm. Heart Sounds - S1 WNL and S2 WNL. Murmurs & Other Heart Sounds - Auscultation of the heart  reveals - No Murmurs.  Abdomen Palpation/Percussion Tenderness - Abdomen is non-tender to palpation. Rigidity (guarding) - Abdomen is soft. Auscultation Auscultation of the abdomen reveals - Bowel sounds normal.  Female Genitourinary Note: Not done, not pertinent to present illness   Musculoskeletal Note: She is in no distress. Left hip can be flexed to about 110, rotate in 10, out 30, abduct to 30 with discomfort on range of motion.  IMAGING I reviewed her MRI scan. She does have a large area of avascular necrosis, but also an area of collapse within this. There is some edema around that area.  Assessment & Plan  Primary osteoarthritis of left hip (M16.12) Avascular necrosis of bone of left hip (M87.052)  Note:Surgical Plans: Left Total Hip Replacement - Anterior Approach  Disposition: Home with family members  PCP: Dr. Coralie Common  IV TXA  Anesthesia Issues: None  Signed electronically by Ok Edwards, III PA-C

## 2015-12-11 DIAGNOSIS — X118XXA Contact with other hot tap-water, initial encounter: Secondary | ICD-10-CM | POA: Diagnosis not present

## 2015-12-11 DIAGNOSIS — T25122A Burn of first degree of left foot, initial encounter: Secondary | ICD-10-CM | POA: Diagnosis not present

## 2015-12-11 DIAGNOSIS — Z23 Encounter for immunization: Secondary | ICD-10-CM | POA: Diagnosis not present

## 2015-12-11 DIAGNOSIS — Z79899 Other long term (current) drug therapy: Secondary | ICD-10-CM | POA: Diagnosis not present

## 2015-12-11 DIAGNOSIS — Z96641 Presence of right artificial hip joint: Secondary | ICD-10-CM | POA: Diagnosis not present

## 2015-12-11 DIAGNOSIS — T25121A Burn of first degree of right foot, initial encounter: Secondary | ICD-10-CM | POA: Diagnosis not present

## 2015-12-12 ENCOUNTER — Inpatient Hospital Stay (HOSPITAL_COMMUNITY): Payer: Medicare Other | Admitting: Certified Registered"

## 2015-12-12 ENCOUNTER — Encounter (HOSPITAL_COMMUNITY)
Admission: RE | Disposition: A | Discharge status: Home / Self Care | Payer: Self-pay | Source: Ambulatory Visit | Attending: Orthopedic Surgery

## 2015-12-12 ENCOUNTER — Encounter (HOSPITAL_COMMUNITY): Payer: Self-pay | Admitting: Certified Registered"

## 2015-12-12 ENCOUNTER — Ambulatory Visit (HOSPITAL_COMMUNITY)
Admission: RE | Admit: 2015-12-12 | Discharge: 2015-12-12 | Disposition: A | Discharge status: Home / Self Care | Payer: Medicare Other | Source: Ambulatory Visit | Attending: Orthopedic Surgery | Admitting: Orthopedic Surgery

## 2015-12-12 LAB — TYPE AND SCREEN
ABO/RH(D): A NEG
ANTIBODY SCREEN: NEGATIVE

## 2015-12-12 SURGERY — ARTHROPLASTY, HIP, TOTAL, ANTERIOR APPROACH
Anesthesia: Choice | Site: Hip | Laterality: Left

## 2015-12-12 MED ORDER — ACETAMINOPHEN 10 MG/ML IV SOLN
1000.0000 mg | Freq: Once | INTRAVENOUS | Status: DC
  Filled 2015-12-12: qty 100

## 2015-12-12 MED ORDER — CEFAZOLIN SODIUM-DEXTROSE 2-4 GM/100ML-% IV SOLN: 2.0000 g | INTRAVENOUS | Status: DC

## 2015-12-12 MED ORDER — LACTATED RINGERS IV SOLN: INTRAVENOUS | Status: DC

## 2015-12-12 MED ORDER — LIDOCAINE 2% (20 MG/ML) 5 ML SYRINGE
INTRAMUSCULAR | Status: AC
  Filled 2015-12-12: qty 5

## 2015-12-12 MED ORDER — FENTANYL CITRATE (PF) 100 MCG/2ML IJ SOLN
INTRAMUSCULAR | Status: AC
  Filled 2015-12-12: qty 2

## 2015-12-12 MED ORDER — PROPOFOL 10 MG/ML IV BOLUS
INTRAVENOUS | Status: AC
  Filled 2015-12-12: qty 20

## 2015-12-12 MED ORDER — ONDANSETRON HCL 4 MG/2ML IJ SOLN
INTRAMUSCULAR | Status: AC
  Filled 2015-12-12: qty 2

## 2015-12-12 MED ORDER — DEXAMETHASONE SODIUM PHOSPHATE 10 MG/ML IJ SOLN
INTRAMUSCULAR | Status: AC
  Filled 2015-12-12: qty 1

## 2015-12-12 MED ORDER — DEXAMETHASONE SODIUM PHOSPHATE 10 MG/ML IJ SOLN: 10.0000 mg | Freq: Once | INTRAMUSCULAR | Status: DC

## 2015-12-12 MED ORDER — MIDAZOLAM HCL 2 MG/2ML IJ SOLN
INTRAMUSCULAR | Status: AC
  Filled 2015-12-12: qty 2

## 2015-12-12 MED ORDER — TRANEXAMIC ACID 1000 MG/10ML IV SOLN
1000.0000 mg | INTRAVENOUS | Status: DC
  Filled 2015-12-12: qty 10

## 2015-12-12 NOTE — Progress Notes (Signed)
Patient arrived on unit at 6:20 am states she burned her right foot. Huge dressing and large blisters noted on right foot and a few smaller blisters noted on toes on left foot. Patient went to [facility] ED on 12/10/15/ started Silvadene cream and an antibiotic. Both feet very swollen and blisters present.Dr. Wynelle Link called at 6:30 and notified of the above. Procedure canceled and he spoke with patient. Patient dressed and left the unit with family.

## 2015-12-14 DIAGNOSIS — J4541 Moderate persistent asthma with (acute) exacerbation: Secondary | ICD-10-CM | POA: Diagnosis not present

## 2015-12-14 DIAGNOSIS — E782 Mixed hyperlipidemia: Secondary | ICD-10-CM | POA: Diagnosis not present

## 2015-12-14 DIAGNOSIS — T310 Burns involving less than 10% of body surface: Secondary | ICD-10-CM | POA: Diagnosis not present

## 2015-12-24 DIAGNOSIS — G894 Chronic pain syndrome: Secondary | ICD-10-CM | POA: Diagnosis not present

## 2015-12-24 DIAGNOSIS — M25559 Pain in unspecified hip: Secondary | ICD-10-CM | POA: Diagnosis not present

## 2015-12-24 DIAGNOSIS — M5137 Other intervertebral disc degeneration, lumbosacral region: Secondary | ICD-10-CM | POA: Diagnosis not present

## 2015-12-24 DIAGNOSIS — Z79891 Long term (current) use of opiate analgesic: Secondary | ICD-10-CM | POA: Diagnosis not present

## 2015-12-24 DIAGNOSIS — Z79899 Other long term (current) drug therapy: Secondary | ICD-10-CM | POA: Diagnosis not present

## 2015-12-24 DIAGNOSIS — M79673 Pain in unspecified foot: Secondary | ICD-10-CM | POA: Diagnosis not present

## 2015-12-26 DIAGNOSIS — J441 Chronic obstructive pulmonary disease with (acute) exacerbation: Secondary | ICD-10-CM | POA: Diagnosis not present

## 2015-12-26 DIAGNOSIS — E784 Other hyperlipidemia: Secondary | ICD-10-CM | POA: Diagnosis not present

## 2016-01-07 DIAGNOSIS — G894 Chronic pain syndrome: Secondary | ICD-10-CM | POA: Diagnosis not present

## 2016-01-07 DIAGNOSIS — M545 Low back pain: Secondary | ICD-10-CM | POA: Diagnosis not present

## 2016-01-07 DIAGNOSIS — Z79899 Other long term (current) drug therapy: Secondary | ICD-10-CM | POA: Diagnosis not present

## 2016-01-07 DIAGNOSIS — M25559 Pain in unspecified hip: Secondary | ICD-10-CM | POA: Diagnosis not present

## 2016-01-07 DIAGNOSIS — Z79891 Long term (current) use of opiate analgesic: Secondary | ICD-10-CM | POA: Diagnosis not present

## 2016-01-15 DIAGNOSIS — R200 Anesthesia of skin: Secondary | ICD-10-CM | POA: Diagnosis not present

## 2016-01-15 DIAGNOSIS — M545 Low back pain: Secondary | ICD-10-CM | POA: Diagnosis not present

## 2016-01-18 DIAGNOSIS — M65241 Calcific tendinitis, right hand: Secondary | ICD-10-CM | POA: Diagnosis not present

## 2016-01-18 DIAGNOSIS — E782 Mixed hyperlipidemia: Secondary | ICD-10-CM | POA: Diagnosis not present

## 2016-01-18 DIAGNOSIS — J4541 Moderate persistent asthma with (acute) exacerbation: Secondary | ICD-10-CM | POA: Diagnosis not present

## 2016-01-21 DIAGNOSIS — Z79891 Long term (current) use of opiate analgesic: Secondary | ICD-10-CM | POA: Diagnosis not present

## 2016-01-21 DIAGNOSIS — G894 Chronic pain syndrome: Secondary | ICD-10-CM | POA: Diagnosis not present

## 2016-01-21 DIAGNOSIS — M545 Low back pain: Secondary | ICD-10-CM | POA: Diagnosis not present

## 2016-01-21 DIAGNOSIS — M25559 Pain in unspecified hip: Secondary | ICD-10-CM | POA: Diagnosis not present

## 2016-01-21 DIAGNOSIS — Z79899 Other long term (current) drug therapy: Secondary | ICD-10-CM | POA: Diagnosis not present

## 2016-01-28 DIAGNOSIS — J4541 Moderate persistent asthma with (acute) exacerbation: Secondary | ICD-10-CM | POA: Diagnosis not present

## 2016-01-28 DIAGNOSIS — J441 Chronic obstructive pulmonary disease with (acute) exacerbation: Secondary | ICD-10-CM | POA: Diagnosis not present

## 2016-01-28 DIAGNOSIS — E782 Mixed hyperlipidemia: Secondary | ICD-10-CM | POA: Diagnosis not present

## 2016-02-01 DIAGNOSIS — M87052 Idiopathic aseptic necrosis of left femur: Secondary | ICD-10-CM | POA: Diagnosis not present

## 2016-02-01 DIAGNOSIS — M1612 Unilateral primary osteoarthritis, left hip: Secondary | ICD-10-CM | POA: Diagnosis not present

## 2016-02-14 DIAGNOSIS — G894 Chronic pain syndrome: Secondary | ICD-10-CM | POA: Diagnosis not present

## 2016-02-14 DIAGNOSIS — Z79891 Long term (current) use of opiate analgesic: Secondary | ICD-10-CM | POA: Diagnosis not present

## 2016-02-14 DIAGNOSIS — M25559 Pain in unspecified hip: Secondary | ICD-10-CM | POA: Diagnosis not present

## 2016-02-14 DIAGNOSIS — M545 Low back pain: Secondary | ICD-10-CM | POA: Diagnosis not present

## 2016-02-14 DIAGNOSIS — Z79899 Other long term (current) drug therapy: Secondary | ICD-10-CM | POA: Diagnosis not present

## 2016-03-10 DIAGNOSIS — J441 Chronic obstructive pulmonary disease with (acute) exacerbation: Secondary | ICD-10-CM | POA: Diagnosis not present

## 2016-03-10 DIAGNOSIS — J4541 Moderate persistent asthma with (acute) exacerbation: Secondary | ICD-10-CM | POA: Diagnosis not present

## 2016-03-10 DIAGNOSIS — E782 Mixed hyperlipidemia: Secondary | ICD-10-CM | POA: Diagnosis not present

## 2016-03-14 DIAGNOSIS — M25559 Pain in unspecified hip: Secondary | ICD-10-CM | POA: Diagnosis not present

## 2016-03-14 DIAGNOSIS — M545 Low back pain: Secondary | ICD-10-CM | POA: Diagnosis not present

## 2016-03-14 DIAGNOSIS — G894 Chronic pain syndrome: Secondary | ICD-10-CM | POA: Diagnosis not present

## 2016-03-14 DIAGNOSIS — Z79891 Long term (current) use of opiate analgesic: Secondary | ICD-10-CM | POA: Diagnosis not present

## 2016-03-14 DIAGNOSIS — Z79899 Other long term (current) drug therapy: Secondary | ICD-10-CM | POA: Diagnosis not present

## 2016-04-11 DIAGNOSIS — M25559 Pain in unspecified hip: Secondary | ICD-10-CM | POA: Diagnosis not present

## 2016-04-11 DIAGNOSIS — G894 Chronic pain syndrome: Secondary | ICD-10-CM | POA: Diagnosis not present

## 2016-04-11 DIAGNOSIS — M545 Low back pain: Secondary | ICD-10-CM | POA: Diagnosis not present

## 2016-04-11 DIAGNOSIS — Z79891 Long term (current) use of opiate analgesic: Secondary | ICD-10-CM | POA: Diagnosis not present

## 2016-04-11 DIAGNOSIS — Z79899 Other long term (current) drug therapy: Secondary | ICD-10-CM | POA: Diagnosis not present

## 2016-05-08 DIAGNOSIS — G894 Chronic pain syndrome: Secondary | ICD-10-CM | POA: Diagnosis not present

## 2016-05-08 DIAGNOSIS — Z79899 Other long term (current) drug therapy: Secondary | ICD-10-CM | POA: Diagnosis not present

## 2016-05-08 DIAGNOSIS — M25559 Pain in unspecified hip: Secondary | ICD-10-CM | POA: Diagnosis not present

## 2016-05-08 DIAGNOSIS — M545 Low back pain: Secondary | ICD-10-CM | POA: Diagnosis not present

## 2016-05-08 DIAGNOSIS — Z79891 Long term (current) use of opiate analgesic: Secondary | ICD-10-CM | POA: Diagnosis not present

## 2016-06-03 ENCOUNTER — Ambulatory Visit: Payer: Self-pay | Admitting: Orthopedic Surgery

## 2016-06-03 NOTE — H&P (Signed)
[name] DOB: 21-Jan-1956 Divorced / Language: English / Race: White Female Date of Admission:  06/25/2016 CC:  Left Hip Pain  History of Present Illness  The patient is a 61 year old female who comes in  for a preoperative History and Physical. The patient is scheduled for a left total hip arthroplasty (anterior) to be performed by Dr. Dione Plover. Aluisio, MD at [facility] on 06-25-2016. The patient is being followed for their left chronic hip pain. Current treatment includes: pain medications (Oxycodone, for all over problems). She had a MRI of the left hip preformed. There was concerned she had avascular necrosis, a possible small area of collapse of the femoral head. Her hip pain is progressively worsening. It is limiting her activities. It was found that she did have a large area of avascular necrosis, but also an area of collapse within this. There is some edema around that area. She has got avascular necrosis. At this point, the most predictable means of improving pain and function is total hip arthroplasty. The procedure, risks, potential complications and rehab course are discussed in detail and the patient elects to proceed. The goals of this procedure are decreased pain and increased function. We discussed the implications of that. She is familiar with it as she had hip replacement on the right in the past. We discussed that the most predictable means of improving her pain and function is going to be total hip arthroplasty. She is ready to proceed with surgery at this time. They have been treated conservatively in the past for the above stated problem and despite conservative measures, they continue to have progressive pain and severe functional limitations and dysfunction. They have failed non-operative management including home exercise, medications. It is felt that they would benefit from undergoing total joint replacement. Risks and benefits of the procedure have been discussed  with the patient and they elect to proceed with surgery. There are no active contraindications to surgery such as ongoing infection or rapidly progressive neurological disease.  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 Drug / Alcohol Addiction  Brother. Rheumatoid Arthritis  Maternal Grandfather.  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 06/28/2015 Under pain contract   Medication History BuPROPion HCl ER (SR) (150MG  Tablet ER 12HR, Oral two times daily) Active. Biotin (5000MCG Tablet, Oral) Active. OxyCODONE HCl (10MG  Tablet, Oral) Active. Montelukast Sodium (10MG  Tablet, Oral) Active. HydrOXYzine Pamoate (25MG  Capsule, Oral) Active. Furosemide (20MG  Tablet, Oral) Active. Super B Complex (Oral) Active. Vitamin D3 (2000UNIT Tablet, Oral) Active. Calcium + D (150-261-330MG -MG-IU Capsule, Oral) Active.  Past Surgical History Cataract Surgery  bilateral Colon Polyp Removal - Colonoscopy  Gallbladder Surgery  open Hip Fracture and Surgery  right Spinal Fusion  lower back; Dr. Arnoldo Morale L2-3-4 Spinal Surgery  Tonsillectomy  Pilonidal Cyst Removal  Tubal Ligation   Review of Systems  General Not Present- Chills, Fatigue, Fever, Memory Loss, Night Sweats, Weight Gain and Weight  Loss. Skin Not Present- Eczema, Hives, Itching, Lesions and  Rash. HEENT Not Present- Dentures, Double Vision, Headache, Hearing Loss, Tinnitus and Visual Loss. Respiratory Not Present- Allergies, Chronic Cough, Coughing up blood, Shortness of breath at rest and Shortness of breath with exertion. Cardiovascular Not Present- Chest Pain, Difficulty Breathing Lying Down, Murmur, Palpitations, Racing/skipping heartbeats and Swelling. Gastrointestinal Not Present- Abdominal Pain, Bloody Stool, Constipation, Diarrhea, Difficulty Swallowing, Heartburn, Jaundice, Loss of appetitie, Nausea and Vomiting. Female Genitourinary Not Present- Blood in Urine, Discharge, Flank Pain, Incontinence, Painful Urination, Urgency, Urinary frequency, Urinary Retention, Urinating at Night and Weak urinary stream. Musculoskeletal Present- Joint Pain. Not Present- Back Pain, Joint Swelling, Morning Stiffness, Muscle Pain, Muscle Weakness and Spasms. Neurological Not Present- Blackout spells, Difficulty with balance, Dizziness, Paralysis, Tremor and Weakness. Psychiatric Not Present- Insomnia.  Vitals 06/03/2016 11:09 AM Weight: 185 lb Height: 67in Weight was reported by patient. Height was reported by patient. Body Surface Area: 1.96 m Body Mass Index: 28.97 kg/m  Pulse: 72 (Regular)  Resp.: 14 (Unlabored)  BP: 132/72 (Sitting, Left Arm, Standard)  Physical Exam General Mental Status -Alert, cooperative and good historian. General Appearance-pleasant, Not in acute distress. Orientation-Oriented X3. Build & Nutrition-Well nourished and Well developed.  Head and Neck Head-normocephalic, atraumatic . Neck Global Assessment - supple, no bruit auscultated on the right, no bruit auscultated on the left.  Eye Vision-Wears corrective lenses. Pupil - Bilateral-Regular and Round. Motion - Bilateral-EOMI.  Chest and Lung Exam Auscultation Breath sounds - clear at anterior chest wall and clear at posterior chest wall. Adventitious sounds - No  Adventitious sounds.  Cardiovascular Auscultation Rhythm - Regular rate and rhythm. Heart Sounds - S1 WNL and S2 WNL. Murmurs & Other Heart Sounds - Auscultation of the heart reveals - No Murmurs.  Abdomen Palpation/Percussion Tenderness - Abdomen is non-tender to palpation. Rigidity (guarding) - Abdomen is soft. Auscultation Auscultation of the abdomen reveals - Bowel sounds normal.  Female Genitourinary Note: Not done, not pertinent to present illness  Musculoskeletal Note: Left hip can be flexed to about 110, rotate in 10, out 30, abduct to 30 with discomfort on range of motion.  IMAGING She does have a large area of avascular necrosis, but also an area of collapse within this. There is some edema around that area.  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 Plan HHPT.  PCP: Dr. Britt Bottom  IV TXA  Anesthesia Issues: None  Patient was instructed on what medications to stop prior to surgery.  Signed electronically by Joelene Millin, III PA-C

## 2016-06-03 NOTE — H&P (Signed)
[name] DOB: 1955/04/20 Divorced / Language: English / Race: White Female Date of Admission:  06/25/2016 CC:  Left Hip Pain  History of Present Illness  The patient is a 61 year old female who comes in  for a preoperative History and Physical. The patient is scheduled for a left total hip arthroplasty (anterior) to be performed by Dr. Dione Plover. Aluisio, MD at [facility] on 06-25-2016. The patient is being followed for their left chronic hip pain. Current treatment includes: pain medications (Oxycodone, for all over problems). She had a MRI of the left hip preformed. There was concerned she had avascular necrosis, a possible small area of collapse of the femoral head. Her hip pain is progressively worsening. It is limiting her activities. It was found that she did have a large area of avascular necrosis, but also an area of collapse within this. There is some edema around that area. She has got avascular necrosis. At this point, the most predictable means of improving pain and function is total hip arthroplasty. The procedure, risks, potential complications and rehab course are discussed in detail and the patient elects to proceed. The goals of this procedure are decreased pain and increased function. We discussed the implications of that. She is familiar with it as she had hip replacement on the right in the past. We discussed that the most predictable means of improving her pain and function is going to be total hip arthroplasty. She is ready to proceed with surgery at this time. They have been treated conservatively in the past for the above stated problem and despite conservative measures, they continue to have progressive pain and severe functional limitations and dysfunction. They have failed non-operative management including home exercise, medications. It is felt that they would benefit from undergoing total joint replacement. Risks and benefits of the procedure have been discussed  with the patient and they elect to proceed with surgery. There are no active contraindications to surgery such as ongoing infection or rapidly progressive neurological disease.  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 Drug / Alcohol Addiction  Brother. Rheumatoid Arthritis  Maternal Grandfather.  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 06/28/2015 Under pain contract   Medication History BuPROPion HCl ER (SR) (150MG  Tablet ER 12HR, Oral two times daily) Active. Biotin (5000MCG Tablet, Oral) Active. OxyCODONE HCl (10MG  Tablet, Oral) Active. Montelukast Sodium (10MG  Tablet, Oral) Active. HydrOXYzine Pamoate (25MG  Capsule, Oral) Active. Furosemide (20MG  Tablet, Oral) Active. Super B Complex (Oral) Active. Vitamin D3 (2000UNIT Tablet, Oral) Active. Calcium + D (150-261-330MG -MG-IU Capsule, Oral) Active.  Past Surgical History Cataract Surgery  bilateral Colon Polyp Removal - Colonoscopy  Gallbladder Surgery  open Hip Fracture and Surgery  right Spinal Fusion  lower back; Dr. Arnoldo Morale L2-3-4 Spinal Surgery  Tonsillectomy  Pilonidal Cyst Removal  Tubal Ligation   Review of Systems  General Not Present- Chills, Fatigue, Fever, Memory Loss, Night Sweats, Weight Gain and Weight  Loss. Skin Not Present- Eczema, Hives, Itching, Lesions and Rash. HEENT  Not Present- Dentures, Double Vision, Headache, Hearing Loss, Tinnitus and Visual Loss. Respiratory Not Present- Allergies, Chronic Cough, Coughing up blood, Shortness of breath at rest and Shortness of breath with exertion. Cardiovascular Not Present- Chest Pain, Difficulty Breathing Lying Down, Murmur, Palpitations, Racing/skipping heartbeats and Swelling. Gastrointestinal Not Present- Abdominal Pain, Bloody Stool, Constipation, Diarrhea, Difficulty Swallowing, Heartburn, Jaundice, Loss of appetitie, Nausea and Vomiting. Female Genitourinary Not Present- Blood in Urine, Discharge, Flank Pain, Incontinence, Painful Urination, Urgency, Urinary frequency, Urinary Retention, Urinating at Night and Weak urinary stream. Musculoskeletal Present- Joint Pain. Not Present- Back Pain, Joint Swelling, Morning Stiffness, Muscle Pain, Muscle Weakness and Spasms. Neurological Not Present- Blackout spells, Difficulty with balance, Dizziness, Paralysis, Tremor and Weakness. Psychiatric Not Present- Insomnia.  Vitals 06/03/2016 11:09 AM Weight: 185 lb Height: 67in Weight was reported by patient. Height was reported by patient. Body Surface Area: 1.96 m Body Mass Index: 28.97 kg/m  Pulse: 72 (Regular)  Resp.: 14 (Unlabored)  BP: 132/72 (Sitting, Left Arm, Standard)  Physical Exam General Mental Status -Alert, cooperative and good historian. General Appearance-pleasant, Not in acute distress. Orientation-Oriented X3. Build & Nutrition-Well nourished and Well developed.  Head and Neck Head-normocephalic, atraumatic . Neck Global Assessment - supple, no bruit auscultated on the right, no bruit auscultated on the left.  Eye Vision-Wears corrective lenses. Pupil - Bilateral-Regular and Round. Motion - Bilateral-EOMI.  Chest and Lung Exam Auscultation Breath sounds - clear at anterior chest wall and clear at posterior chest wall. Adventitious sounds - No Adventitious  sounds.  Cardiovascular Auscultation Rhythm - Regular rate and rhythm. Heart Sounds - S1 WNL and S2 WNL. Murmurs & Other Heart Sounds - Auscultation of the heart reveals - No Murmurs.  Abdomen Palpation/Percussion Tenderness - Abdomen is non-tender to palpation. Rigidity (guarding) - Abdomen is soft. Auscultation Auscultation of the abdomen reveals - Bowel sounds normal.  Female Genitourinary Note: Not done, not pertinent to present illness  Musculoskeletal Note: Left hip can be flexed to about 110, rotate in 10, out 30, abduct to 30 with discomfort on range of motion.  IMAGING She does have a large area of avascular necrosis, but also an area of collapse within this. There is some edema around that area.  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 Plan HHPT.  PCP: Dr. Britt Bottom  IV TXA  Anesthesia Issues: None  Patient was instructed on what medications to stop prior to surgery.  Signed electronically by Joelene Millin, III PA-C

## 2016-06-05 ENCOUNTER — Ambulatory Visit: Payer: Self-pay | Admitting: Orthopedic Surgery

## 2016-06-06 ENCOUNTER — Other Ambulatory Visit: Payer: Self-pay | Admitting: Orthopedic Surgery

## 2016-06-09 ENCOUNTER — Other Ambulatory Visit: Payer: Self-pay | Admitting: Orthopedic Surgery

## 2016-06-10 ENCOUNTER — Other Ambulatory Visit: Payer: Self-pay | Admitting: Orthopedic Surgery

## 2016-06-16 ENCOUNTER — Other Ambulatory Visit (HOSPITAL_COMMUNITY): Payer: Self-pay | Admitting: Emergency Medicine

## 2016-06-16 NOTE — Patient Instructions (Addendum)
[name]  06/16/2016   Your procedure is scheduled on: 06-25-16  Report to [facility]  [facility]      Report to admitting at 6AM    Call this number if you have problems the morning of Peters  [phone]   Remember: ONLY 1 PERSON MAY GO WITH YOU TO SHORT STAY TO GET  READY MORNING OF YOUR Peters.  Do not eat food or drink liquids :After Midnight.     Take these medicines the morning of Peters with A SIP OF WATER: oxycodone as needed, inhaler as needed (may bring to hospital), ranitidine(zantac) as needed                                You may not have any metal on your body including hair pins and              piercings  Do not wear jewelry, make-up, lotions, powders or perfumes, deodorant             Do not wear nail polish.  Do not shave  48 hours prior to Peters.     Do not bring valuables to the hospital. [name].  Contacts, dentures or bridgework may not be worn into Peters.  Leave suitcase in the car. After Peters it may be brought to your room.                Please read over the following fact sheets you were given: _____________________________________________________________________             [name] Before Peters, you can play an important role.  Because skin is not sterile, your skin needs to be as free of germs as possible.  You can reduce the number of germs on your skin by washing with CHG (chlorahexidine gluconate) soap before Peters.  CHG is an antiseptic cleaner which kills germs and bonds with the skin to continue killing germs even after washing. Please DO NOT use if you have an allergy to CHG or antibacterial soaps.  If your skin becomes reddened/irritated stop using the CHG and inform your nurse when you arrive at Short Stay. Do not shave (including legs and underarms) for at least 48 hours prior to the first CHG shower.  You may  shave your face/neck. Please follow these instructions carefully:  1.  Shower with CHG Soap the night before Peters and the  morning of Peters.  2.  If you choose to wash your hair, wash your hair first as usual with your  normal  shampoo.  3.  After you shampoo, rinse your hair and body thoroughly to remove the  shampoo.                           4.  Use CHG as you would any other liquid soap.  You can apply chg directly  to the skin and wash                       Gently with a scrungie or clean washcloth.  5.  Apply the CHG Soap to your body ONLY FROM THE NECK DOWN.  Do not use on face/ open                           Wound or open sores. Avoid contact with eyes, ears mouth and genitals (private parts).                       Wash face,  Genitals (private parts) with your normal soap.             6.  Wash thoroughly, paying special attention to the area where your Peters  will be performed.  7.  Thoroughly rinse your body with warm water from the neck down.  8.  DO NOT shower/wash with your normal soap after using and rinsing off  the CHG Soap.                9.  Pat yourself dry with a clean towel.            10.  Wear clean pajamas.            11.  Place clean sheets on your bed the night of your first shower and do not  sleep with pets. Day of Peters : Do not apply any lotions/deodorants the morning of Peters.  Please wear clean clothes to the hospital/Peters center.  FAILURE TO FOLLOW THESE INSTRUCTIONS MAY RESULT IN THE CANCELLATION OF YOUR Peters PATIENT SIGNATURE_________________________________  NURSE SIGNATURE__________________________________  ________________________________________________________________________   [name]  An incentive spirometer is a tool that can help keep your lungs clear and active. This tool measures how well you are filling your lungs with each breath. Taking long deep breaths may help reverse or decrease the chance of developing  breathing (pulmonary) problems (especially infection) following:  A long period of time when you are unable to move or be active. BEFORE THE PROCEDURE   If the spirometer includes an indicator to show your best effort, your nurse or respiratory therapist will set it to a desired goal.  If possible, sit up straight or lean slightly forward. Try not to slouch.  Hold the incentive spirometer in an upright position. INSTRUCTIONS FOR USE  1. Sit on the edge of your bed if possible, or sit up as far as you can in bed or on a chair. 2. Hold the incentive spirometer in an upright position. 3. Breathe out normally. 4. Place the mouthpiece in your mouth and seal your lips tightly around it. 5. Breathe in slowly and as deeply as possible, raising the piston or the ball toward the top of the column. 6. Hold your breath for 3-5 seconds or for as long as possible. Allow the piston or ball to fall to the bottom of the column. 7. Remove the mouthpiece from your mouth and breathe out normally. 8. Rest for a few seconds and repeat Steps 1 through 7 at least 10 times every 1-2 hours when you are awake. Take your time and take a few normal breaths between deep breaths. 9. The spirometer may include an indicator to show your best effort. Use the indicator as a goal to work toward during each repetition. 10. After each set of 10 deep breaths, practice coughing to be sure your lungs are clear. If you have an incision (the cut made at the time of Peters), support your incision when coughing by placing a pillow or rolled up towels firmly against it. Once you are able to get out of  bed, walk around indoors and cough well. You may stop using the incentive spirometer when instructed by your caregiver.  RISKS AND COMPLICATIONS  Take your time so you do not get dizzy or light-headed.  If you are in pain, you may need to take or ask for pain medication before doing incentive spirometry. It is harder to take a deep  breath if you are having pain. AFTER USE  Rest and breathe slowly and easily.  It can be helpful to keep track of a log of your progress. Your caregiver can provide you with a simple table to help with this. If you are using the spirometer at home, follow these instructions: [place] IF:   You are having difficultly using the spirometer.  You have trouble using the spirometer as often as instructed.  Your pain medication is not giving enough relief while using the spirometer.  You develop fever of 100.5 F (38.1 C) or higher. SEEK IMMEDIATE MEDICAL CARE IF:   You cough up bloody sputum that had not been present before.  You develop fever of 102 F (38.9 C) or greater.  You develop worsening pain at or near the incision site. MAKE SURE YOU:   Understand these instructions.  Will watch your condition.  Will get help right away if you are not doing well or get worse. Document Released: 06/23/2006 Document Revised: 05/05/2011 Document Reviewed: 08/24/2006 ExitCare Patient Information 2014 Exit[employer], [place].   ________________________________________________________________________  WHAT IS A BLOOD TRANSFUSION? Blood Transfusion Information  A transfusion is the replacement of blood or some of its parts. Blood is made up of multiple cells which provide different functions.  Red blood cells carry oxygen and are used for blood loss replacement.  White blood cells fight against infection.  Platelets control bleeding.  Plasma helps clot blood.  Other blood products are available for specialized needs, such as hemophilia or other clotting disorders. BEFORE THE TRANSFUSION  Who gives blood for transfusions?   Healthy volunteers who are fully evaluated to make sure their blood is safe. This is blood bank blood. Transfusion therapy is the safest it has ever been in the practice of medicine. Before blood is taken from a donor, a complete history is taken to make sure  that person has no history of diseases nor engages in risky social behavior (examples are intravenous drug use or sexual activity with multiple partners). The donor's travel history is screened to minimize risk of transmitting infections, such as malaria. The donated blood is tested for signs of infectious diseases, such as HIV and hepatitis. The blood is then tested to be sure it is compatible with you in order to minimize the chance of a transfusion reaction. If you or a relative donates blood, this is often done in anticipation of Peters and is not appropriate for emergency situations. It takes many days to process the donated blood. RISKS AND COMPLICATIONS Although transfusion therapy is very safe and saves many lives, the main dangers of transfusion include:   Getting an infectious disease.  Developing a transfusion reaction. This is an allergic reaction to something in the blood you were given. Every precaution is taken to prevent this. The decision to have a blood transfusion has been considered carefully by your caregiver before blood is given. Blood is not given unless the benefits outweigh the risks. AFTER THE TRANSFUSION  Right after receiving a blood transfusion, you will usually feel much better and more energetic. This is especially true if your red blood  cells have gotten low (anemic). The transfusion raises the level of the red blood cells which carry oxygen, and this usually causes an energy increase.  The nurse administering the transfusion will monitor you carefully for complications. HOME CARE INSTRUCTIONS  No special instructions are needed after a transfusion. You may find your energy is better. Speak with your caregiver about any limitations on activity for underlying diseases you may have. SEEK MEDICAL CARE IF:   Your condition is not improving after your transfusion.  You develop redness or irritation at the intravenous (IV) site. SEEK IMMEDIATE MEDICAL CARE IF:  Any of  the following symptoms occur over the next 12 hours:  Shaking chills.  You have a temperature by mouth above 102 F (38.9 C), not controlled by medicine.  Chest, back, or muscle pain.  People around you feel you are not acting correctly or are confused.  Shortness of breath or difficulty breathing.  Dizziness and fainting.  You get a rash or develop hives.  You have a decrease in urine output.  Your urine turns a dark color or changes to pink, red, or brown. Any of the following symptoms occur over the next 10 days:  You have a temperature by mouth above 102 F (38.9 C), not controlled by medicine.  Shortness of breath.  Weakness after normal activity.  The white part of the eye turns yellow (jaundice).  You have a decrease in the amount of urine or are urinating less often.  Your urine turns a dark color or changes to pink, red, or brown. Document Released: 02/08/2000 Document Revised: 05/05/2011 Document Reviewed: 09/27/2007 [facility] Patient Information 2014 [place], [place].  _______________________________________________________________________

## 2016-06-19 ENCOUNTER — Encounter (HOSPITAL_COMMUNITY): Payer: Medicare Other

## 2016-06-19 ENCOUNTER — Encounter (HOSPITAL_COMMUNITY): Payer: Self-pay

## 2016-06-19 ENCOUNTER — Encounter (HOSPITAL_COMMUNITY)
Admission: RE | Admit: 2016-06-19 | Discharge: 2016-06-19 | Disposition: A | Discharge status: Home / Self Care | Payer: Medicare Other | Source: Ambulatory Visit | Attending: Orthopedic Surgery | Admitting: Orthopedic Surgery

## 2016-06-19 DIAGNOSIS — Z01812 Encounter for preprocedural laboratory examination: Secondary | ICD-10-CM | POA: Insufficient documentation

## 2016-06-19 DIAGNOSIS — D1801 Hemangioma of skin and subcutaneous tissue: Secondary | ICD-10-CM | POA: Insufficient documentation

## 2016-06-19 DIAGNOSIS — R928 Other abnormal and inconclusive findings on diagnostic imaging of breast: Secondary | ICD-10-CM | POA: Diagnosis not present

## 2016-06-19 LAB — COMPREHENSIVE METABOLIC PANEL
ALBUMIN: 3.9 g/dL (ref 3.5–5.0)
ALT: 13 U/L — ABNORMAL LOW (ref 14–54)
ANION GAP: 8 (ref 5–15)
AST: 16 U/L (ref 15–41)
Alkaline Phosphatase: 84 U/L (ref 38–126)
BUN: 13 mg/dL (ref 6–20)
CHLORIDE: 104 mmol/L (ref 101–111)
CO2: 27 mmol/L (ref 22–32)
Calcium: 9 mg/dL (ref 8.9–10.3)
Creatinine, Ser: 0.89 mg/dL (ref 0.44–1.00)
GFR calc Af Amer: >60 mL/min (ref >60)
GFR calc non Af Amer: >60 mL/min (ref >60)
GLUCOSE: 98 mg/dL (ref 65–99)
POTASSIUM: 4.2 mmol/L (ref 3.5–5.1)
SODIUM: 139 mmol/L (ref 135–145)
Total Bilirubin: 0.5 mg/dL (ref 0.3–1.2)
Total Protein: 6.7 g/dL (ref 6.5–8.1)

## 2016-06-19 LAB — SURGICAL PCR SCREEN
MRSA, PCR: NEGATIVE
STAPHYLOCOCCUS AUREUS: NEGATIVE

## 2016-06-19 LAB — CBC
HEMATOCRIT: 42.2 % (ref 36.0–46.0)
Hemoglobin: 14.1 g/dL (ref 12.0–15.0)
MCH: 29.6 pg (ref 26.0–34.0)
MCHC: 33.4 g/dL (ref 30.0–36.0)
MCV: 88.7 fL (ref 78.0–100.0)
PLATELETS: 83 10*3/uL — AB (ref 150–400)
RBC: 4.76 MIL/uL (ref 3.87–5.11)
RDW: 13 % (ref 11.5–15.5)
WBC: 8.3 10*3/uL (ref 4.0–10.5)

## 2016-06-19 LAB — PROTIME-INR
INR: 0.93
Prothrombin Time: 12.4 seconds (ref 11.4–15.2)

## 2016-06-19 LAB — APTT: aPTT: 26 seconds (ref 24–36)

## 2016-06-19 NOTE — Progress Notes (Signed)
CBC results routed to Dr Wynelle Link via epic

## 2016-06-25 ENCOUNTER — Inpatient Hospital Stay (HOSPITAL_COMMUNITY): Payer: Medicare Other

## 2016-06-25 ENCOUNTER — Inpatient Hospital Stay (HOSPITAL_COMMUNITY): Payer: Medicare Other | Admitting: Registered Nurse

## 2016-06-25 ENCOUNTER — Encounter (HOSPITAL_COMMUNITY)
Admission: RE | Disposition: A | Discharge status: Home Health | Payer: Self-pay | Source: Ambulatory Visit | Attending: Orthopedic Surgery

## 2016-06-25 ENCOUNTER — Encounter (HOSPITAL_COMMUNITY): Payer: Self-pay | Admitting: *Deleted

## 2016-06-25 ENCOUNTER — Inpatient Hospital Stay (HOSPITAL_COMMUNITY)
Admission: RE | Admit: 2016-06-25 | Discharge: 2016-06-26 | DRG: 470 | Disposition: A | Discharge status: Home Health | Payer: Medicare Other | Source: Ambulatory Visit | Attending: Orthopedic Surgery | Admitting: Orthopedic Surgery

## 2016-06-25 DIAGNOSIS — Z981 Arthrodesis status: Secondary | ICD-10-CM | POA: Diagnosis not present

## 2016-06-25 DIAGNOSIS — J45909 Unspecified asthma, uncomplicated: Secondary | ICD-10-CM | POA: Diagnosis present

## 2016-06-25 DIAGNOSIS — M169 Osteoarthritis of hip, unspecified: Secondary | ICD-10-CM | POA: Diagnosis present

## 2016-06-25 DIAGNOSIS — M879 Osteonecrosis, unspecified: Secondary | ICD-10-CM | POA: Diagnosis not present

## 2016-06-25 DIAGNOSIS — Z96649 Presence of unspecified artificial hip joint: Secondary | ICD-10-CM

## 2016-06-25 DIAGNOSIS — K219 Gastro-esophageal reflux disease without esophagitis: Secondary | ICD-10-CM | POA: Diagnosis present

## 2016-06-25 DIAGNOSIS — M1612 Unilateral primary osteoarthritis, left hip: Secondary | ICD-10-CM | POA: Diagnosis not present

## 2016-06-25 DIAGNOSIS — Z96641 Presence of right artificial hip joint: Secondary | ICD-10-CM | POA: Diagnosis present

## 2016-06-25 DIAGNOSIS — F319 Bipolar disorder, unspecified: Secondary | ICD-10-CM | POA: Diagnosis present

## 2016-06-25 DIAGNOSIS — G8929 Other chronic pain: Secondary | ICD-10-CM | POA: Diagnosis present

## 2016-06-25 DIAGNOSIS — Z87891 Personal history of nicotine dependence: Secondary | ICD-10-CM | POA: Diagnosis not present

## 2016-06-25 DIAGNOSIS — Z96642 Presence of left artificial hip joint: Secondary | ICD-10-CM | POA: Diagnosis not present

## 2016-06-25 DIAGNOSIS — R928 Other abnormal and inconclusive findings on diagnostic imaging of breast: Secondary | ICD-10-CM | POA: Diagnosis not present

## 2016-06-25 HISTORY — PX: TOTAL HIP ARTHROPLASTY: SHX124

## 2016-06-25 LAB — CBC
HEMATOCRIT: 34.7 % — AB (ref 36.0–46.0)
HEMOGLOBIN: 11.4 g/dL — AB (ref 12.0–15.0)
MCH: 29 pg (ref 26.0–34.0)
MCHC: 32.9 g/dL (ref 30.0–36.0)
MCV: 88.3 fL (ref 78.0–100.0)
PLATELETS: 60 10*3/uL — AB (ref 150–400)
RBC: 3.93 MIL/uL (ref 3.87–5.11)
RDW: 12.9 % (ref 11.5–15.5)
WBC: 9.4 10*3/uL (ref 4.0–10.5)

## 2016-06-25 LAB — TYPE AND SCREEN
ABO/RH(D): A NEG
ANTIBODY SCREEN: NEGATIVE

## 2016-06-25 SURGERY — ARTHROPLASTY, HIP, TOTAL, ANTERIOR APPROACH
Anesthesia: General | Site: Hip | Laterality: Left

## 2016-06-25 MED ORDER — FENTANYL CITRATE (PF) 250 MCG/5ML IJ SOLN
INTRAMUSCULAR | Status: AC
  Filled 2016-06-25: qty 5

## 2016-06-25 MED ORDER — ALBUTEROL SULFATE (2.5 MG/3ML) 0.083% IN NEBU: 2.5000 mg | INHALATION_SOLUTION | Freq: Four times a day (QID) | RESPIRATORY_TRACT | Status: DC | PRN

## 2016-06-25 MED ORDER — CEFAZOLIN SODIUM-DEXTROSE 2-4 GM/100ML-% IV SOLN
INTRAVENOUS | Status: AC
  Filled 2016-06-25: qty 100

## 2016-06-25 MED ORDER — SUGAMMADEX SODIUM 200 MG/2ML IV SOLN
INTRAVENOUS | Status: AC
  Filled 2016-06-25: qty 2

## 2016-06-25 MED ORDER — ACETAMINOPHEN 500 MG PO TABS
1000.0000 mg | ORAL_TABLET | Freq: Four times a day (QID) | ORAL | Status: AC
  Administered 2016-06-25 – 2016-06-26 (×4): 1000 mg via ORAL
  Filled 2016-06-25 (×5): qty 2

## 2016-06-25 MED ORDER — CEFAZOLIN SODIUM-DEXTROSE 2-4 GM/100ML-% IV SOLN
2.0000 g | INTRAVENOUS | Status: AC
  Administered 2016-06-25: 2 g via INTRAVENOUS

## 2016-06-25 MED ORDER — ROCURONIUM BROMIDE 50 MG/5ML IV SOSY
PREFILLED_SYRINGE | INTRAVENOUS | Status: AC
  Filled 2016-06-25: qty 5

## 2016-06-25 MED ORDER — METOCLOPRAMIDE HCL 5 MG/ML IJ SOLN: 5.0000 mg | Freq: Three times a day (TID) | INTRAMUSCULAR | Status: DC | PRN

## 2016-06-25 MED ORDER — MEPERIDINE HCL 50 MG/ML IJ SOLN: 6.2500 mg | INTRAMUSCULAR | Status: DC | PRN

## 2016-06-25 MED ORDER — OXYCODONE HCL 5 MG PO TABS
5.0000 mg | ORAL_TABLET | ORAL | Status: DC | PRN
  Administered 2016-06-25 (×2): 5 mg via ORAL
  Administered 2016-06-25 – 2016-06-26 (×5): 10 mg via ORAL
  Filled 2016-06-25: qty 2
  Filled 2016-06-25: qty 1
  Filled 2016-06-25: qty 2
  Filled 2016-06-25: qty 1
  Filled 2016-06-25 (×3): qty 2

## 2016-06-25 MED ORDER — ROCURONIUM BROMIDE 10 MG/ML (PF) SYRINGE
PREFILLED_SYRINGE | INTRAVENOUS | Status: DC | PRN
  Administered 2016-06-25: 20 mg via INTRAVENOUS
  Administered 2016-06-25: 50 mg via INTRAVENOUS

## 2016-06-25 MED ORDER — OXYCODONE HCL ER 15 MG PO T12A
15.0000 mg | EXTENDED_RELEASE_TABLET | Freq: Two times a day (BID) | ORAL | Status: DC
  Administered 2016-06-25 – 2016-06-26 (×2): 15 mg via ORAL
  Filled 2016-06-25 (×2): qty 1

## 2016-06-25 MED ORDER — CHLORHEXIDINE GLUCONATE 4 % EX LIQD: 60.0000 mL | Freq: Once | CUTANEOUS | Status: DC

## 2016-06-25 MED ORDER — ONDANSETRON HCL 4 MG PO TABS: 4.0000 mg | ORAL_TABLET | Freq: Four times a day (QID) | ORAL | Status: DC | PRN

## 2016-06-25 MED ORDER — ONDANSETRON HCL 4 MG/2ML IJ SOLN
INTRAMUSCULAR | Status: AC
  Filled 2016-06-25: qty 2

## 2016-06-25 MED ORDER — LIDOCAINE 2% (20 MG/ML) 5 ML SYRINGE
INTRAMUSCULAR | Status: AC
  Filled 2016-06-25: qty 5

## 2016-06-25 MED ORDER — FLEET ENEMA 7-19 GM/118ML RE ENEM: 1.0000 | ENEMA | Freq: Once | RECTAL | Status: DC | PRN

## 2016-06-25 MED ORDER — MIDAZOLAM HCL 5 MG/5ML IJ SOLN
INTRAMUSCULAR | Status: DC | PRN
  Administered 2016-06-25: 2 mg via INTRAVENOUS

## 2016-06-25 MED ORDER — FENTANYL CITRATE (PF) 100 MCG/2ML IJ SOLN
INTRAMUSCULAR | Status: DC | PRN
  Administered 2016-06-25: 100 ug via INTRAVENOUS

## 2016-06-25 MED ORDER — ACETAMINOPHEN 650 MG RE SUPP: 650.0000 mg | Freq: Four times a day (QID) | RECTAL | Status: DC | PRN

## 2016-06-25 MED ORDER — FENTANYL CITRATE (PF) 100 MCG/2ML IJ SOLN
INTRAMUSCULAR | Status: AC
  Filled 2016-06-25: qty 2

## 2016-06-25 MED ORDER — PROMETHAZINE HCL 25 MG/ML IJ SOLN
6.2500 mg | INTRAMUSCULAR | Status: DC | PRN
  Administered 2016-06-25: 6.25 mg via INTRAVENOUS

## 2016-06-25 MED ORDER — POLYETHYLENE GLYCOL 3350 17 G PO PACK: 17.0000 g | PACK | Freq: Every day | ORAL | Status: DC | PRN

## 2016-06-25 MED ORDER — PHENOL 1.4 % MT LIQD: 1.0000 | OROMUCOSAL | Status: DC | PRN

## 2016-06-25 MED ORDER — METHOCARBAMOL 500 MG PO TABS: 500.0000 mg | ORAL_TABLET | Freq: Four times a day (QID) | ORAL | Status: DC | PRN

## 2016-06-25 MED ORDER — DOCUSATE SODIUM 100 MG PO CAPS
100.0000 mg | ORAL_CAPSULE | Freq: Two times a day (BID) | ORAL | Status: DC
  Administered 2016-06-25 – 2016-06-26 (×2): 100 mg via ORAL
  Filled 2016-06-25 (×2): qty 1

## 2016-06-25 MED ORDER — MENTHOL 3 MG MT LOZG: 1.0000 | LOZENGE | OROMUCOSAL | Status: DC | PRN

## 2016-06-25 MED ORDER — BUPIVACAINE HCL (PF) 0.25 % IJ SOLN
INTRAMUSCULAR | Status: DC | PRN
  Administered 2016-06-25: 30 mL

## 2016-06-25 MED ORDER — DEXAMETHASONE SODIUM PHOSPHATE 10 MG/ML IJ SOLN
10.0000 mg | Freq: Once | INTRAMUSCULAR | Status: AC
  Administered 2016-06-25: 10 mg via INTRAVENOUS

## 2016-06-25 MED ORDER — ACETAMINOPHEN 10 MG/ML IV SOLN
1000.0000 mg | Freq: Once | INTRAVENOUS | Status: AC
  Administered 2016-06-25: 1000 mg via INTRAVENOUS

## 2016-06-25 MED ORDER — CEFAZOLIN SODIUM-DEXTROSE 2-4 GM/100ML-% IV SOLN
2.0000 g | Freq: Four times a day (QID) | INTRAVENOUS | Status: AC
  Administered 2016-06-25 (×2): 2 g via INTRAVENOUS
  Filled 2016-06-25 (×2): qty 100

## 2016-06-25 MED ORDER — OXYCODONE ER 13.5 MG PO C12A: 13.5000 mg | EXTENDED_RELEASE_CAPSULE | Freq: Two times a day (BID) | ORAL | Status: DC

## 2016-06-25 MED ORDER — BISACODYL 10 MG RE SUPP: 10.0000 mg | Freq: Every day | RECTAL | Status: DC | PRN

## 2016-06-25 MED ORDER — ACETAMINOPHEN 325 MG PO TABS
650.0000 mg | ORAL_TABLET | Freq: Four times a day (QID) | ORAL | Status: DC | PRN
  Administered 2016-06-26: 16:00:00 650 mg via ORAL
  Filled 2016-06-25: qty 2

## 2016-06-25 MED ORDER — TRAMADOL HCL 50 MG PO TABS: 50.0000 mg | ORAL_TABLET | Freq: Four times a day (QID) | ORAL | Status: DC | PRN

## 2016-06-25 MED ORDER — ONDANSETRON HCL 4 MG/2ML IJ SOLN
INTRAMUSCULAR | Status: DC | PRN
  Administered 2016-06-25: 4 mg via INTRAVENOUS

## 2016-06-25 MED ORDER — DEXAMETHASONE SODIUM PHOSPHATE 10 MG/ML IJ SOLN
INTRAMUSCULAR | Status: AC
  Filled 2016-06-25: qty 1

## 2016-06-25 MED ORDER — BUPIVACAINE HCL (PF) 0.25 % IJ SOLN
INTRAMUSCULAR | Status: AC
  Filled 2016-06-25: qty 30

## 2016-06-25 MED ORDER — METHOCARBAMOL 1000 MG/10ML IJ SOLN
500.0000 mg | Freq: Four times a day (QID) | INTRAVENOUS | Status: DC | PRN
  Administered 2016-06-25: 500 mg via INTRAVENOUS
  Filled 2016-06-25: qty 550

## 2016-06-25 MED ORDER — PROPOFOL 10 MG/ML IV BOLUS
INTRAVENOUS | Status: DC | PRN
  Administered 2016-06-25: 200 mg via INTRAVENOUS

## 2016-06-25 MED ORDER — SODIUM CHLORIDE 0.9 % IV SOLN
INTRAVENOUS | Status: DC
  Administered 2016-06-25: 15:00:00 via INTRAVENOUS

## 2016-06-25 MED ORDER — SODIUM CHLORIDE 0.9 % IR SOLN
Status: DC | PRN
  Administered 2016-06-25: 1000 mL

## 2016-06-25 MED ORDER — MONTELUKAST SODIUM 10 MG PO TABS
10.0000 mg | ORAL_TABLET | Freq: Every day | ORAL | Status: DC
  Administered 2016-06-25: 10 mg via ORAL
  Filled 2016-06-25: qty 1

## 2016-06-25 MED ORDER — LIDOCAINE 2% (20 MG/ML) 5 ML SYRINGE
INTRAMUSCULAR | Status: DC | PRN
  Administered 2016-06-25: 100 mg via INTRAVENOUS

## 2016-06-25 MED ORDER — TRANEXAMIC ACID 1000 MG/10ML IV SOLN
1000.0000 mg | Freq: Once | INTRAVENOUS | Status: AC
  Administered 2016-06-25: 1000 mg via INTRAVENOUS
  Filled 2016-06-25: qty 1100

## 2016-06-25 MED ORDER — PROPOFOL 10 MG/ML IV BOLUS
INTRAVENOUS | Status: AC
  Filled 2016-06-25: qty 60

## 2016-06-25 MED ORDER — DIPHENHYDRAMINE HCL 12.5 MG/5ML PO ELIX
12.5000 mg | ORAL_SOLUTION | ORAL | Status: DC | PRN
Start: 2016-06-25 — End: 2016-06-26

## 2016-06-25 MED ORDER — MORPHINE SULFATE (PF) 4 MG/ML IV SOLN: 1.0000 mg | INTRAVENOUS | Status: DC | PRN

## 2016-06-25 MED ORDER — HYDROMORPHONE HCL 1 MG/ML IJ SOLN
0.2500 mg | INTRAMUSCULAR | Status: DC | PRN
  Administered 2016-06-25 (×3): 0.5 mg via INTRAVENOUS

## 2016-06-25 MED ORDER — TRANEXAMIC ACID 1000 MG/10ML IV SOLN
1000.0000 mg | INTRAVENOUS | Status: AC
  Administered 2016-06-25: 1000 mg via INTRAVENOUS
  Filled 2016-06-25: qty 1100

## 2016-06-25 MED ORDER — METOCLOPRAMIDE HCL 5 MG PO TABS: 5.0000 mg | ORAL_TABLET | Freq: Three times a day (TID) | ORAL | Status: DC | PRN

## 2016-06-25 MED ORDER — FAMOTIDINE 20 MG PO TABS
40.0000 mg | ORAL_TABLET | Freq: Every day | ORAL | Status: DC
Start: 2016-06-26 — End: 2016-06-26
  Administered 2016-06-26: 40 mg via ORAL
  Filled 2016-06-25: qty 2

## 2016-06-25 MED ORDER — MIDAZOLAM HCL 2 MG/2ML IJ SOLN
INTRAMUSCULAR | Status: AC
  Filled 2016-06-25: qty 2

## 2016-06-25 MED ORDER — LACTATED RINGERS IV SOLN
INTRAVENOUS | Status: DC
  Administered 2016-06-25 (×3): via INTRAVENOUS

## 2016-06-25 MED ORDER — ACETAMINOPHEN 10 MG/ML IV SOLN
INTRAVENOUS | Status: AC
  Filled 2016-06-25: qty 100

## 2016-06-25 MED ORDER — HYDROMORPHONE HCL 1 MG/ML IJ SOLN
INTRAMUSCULAR | Status: AC
  Filled 2016-06-25: qty 1

## 2016-06-25 MED ORDER — SUGAMMADEX SODIUM 200 MG/2ML IV SOLN
INTRAVENOUS | Status: DC | PRN
  Administered 2016-06-25: 200 mg via INTRAVENOUS

## 2016-06-25 MED ORDER — ALBUTEROL SULFATE HFA 108 (90 BASE) MCG/ACT IN AERS: 2.0000 | INHALATION_SPRAY | Freq: Four times a day (QID) | RESPIRATORY_TRACT | Status: DC | PRN

## 2016-06-25 MED ORDER — DEXAMETHASONE SODIUM PHOSPHATE 10 MG/ML IJ SOLN
10.0000 mg | Freq: Once | INTRAMUSCULAR | Status: AC
  Administered 2016-06-26: 10 mg via INTRAVENOUS
  Filled 2016-06-25: qty 1

## 2016-06-25 MED ORDER — RIVAROXABAN 10 MG PO TABS
10.0000 mg | ORAL_TABLET | Freq: Every day | ORAL | Status: DC
  Administered 2016-06-26: 08:00:00 10 mg via ORAL
  Filled 2016-06-25: qty 1

## 2016-06-25 MED ORDER — ONDANSETRON HCL 4 MG/2ML IJ SOLN: 4.0000 mg | Freq: Four times a day (QID) | INTRAMUSCULAR | Status: DC | PRN

## 2016-06-25 MED ORDER — FENTANYL CITRATE (PF) 250 MCG/5ML IJ SOLN
INTRAMUSCULAR | Status: DC | PRN
  Administered 2016-06-25: 100 ug via INTRAVENOUS
  Administered 2016-06-25 (×3): 50 ug via INTRAVENOUS

## 2016-06-25 MED ORDER — PROMETHAZINE HCL 25 MG/ML IJ SOLN
INTRAMUSCULAR | Status: AC
  Filled 2016-06-25: qty 1

## 2016-06-25 SURGICAL SUPPLY — 36 items
BAG DECANTER FOR FLEXI CONT (MISCELLANEOUS) ×3 IMPLANT
BAG ZIPLOCK 12X15 (MISCELLANEOUS) IMPLANT
BLADE SAG 18X100X1.27 (BLADE) ×3 IMPLANT
CAPT HIP TOTAL 2 ×3 IMPLANT
CLOSURE WOUND 1/2 X4 (GAUZE/BANDAGES/DRESSINGS) ×1
CLOTH BEACON ORANGE TIMEOUT ST (SAFETY) ×3 IMPLANT
COVER PERINEAL POST (MISCELLANEOUS) ×3 IMPLANT
COVER SURGICAL LIGHT HANDLE (MISCELLANEOUS) ×3 IMPLANT
DECANTER SPIKE VIAL GLASS SM (MISCELLANEOUS) ×3 IMPLANT
DRAPE STERI IOBAN 125X83 (DRAPES) ×3 IMPLANT
DRAPE U-SHAPE 47X51 STRL (DRAPES) ×6 IMPLANT
DRSG ADAPTIC 3X8 NADH LF (GAUZE/BANDAGES/DRESSINGS) ×3 IMPLANT
DRSG MEPILEX BORDER 4X4 (GAUZE/BANDAGES/DRESSINGS) ×3 IMPLANT
DRSG MEPILEX BORDER 4X8 (GAUZE/BANDAGES/DRESSINGS) ×3 IMPLANT
DURAPREP 26ML APPLICATOR (WOUND CARE) ×3 IMPLANT
ELECT REM PT RETURN 15FT ADLT (MISCELLANEOUS) ×3 IMPLANT
EVACUATOR 1/8 PVC DRAIN (DRAIN) ×3 IMPLANT
GLOVE BIO SURGEON STRL SZ7.5 (GLOVE) ×3 IMPLANT
GLOVE BIO SURGEON STRL SZ8 (GLOVE) ×6 IMPLANT
GLOVE BIOGEL PI IND STRL 7.0 (GLOVE) ×4 IMPLANT
GLOVE BIOGEL PI IND STRL 8 (GLOVE) ×2 IMPLANT
GLOVE BIOGEL PI INDICATOR 7.0 (GLOVE) ×8
GLOVE BIOGEL PI INDICATOR 8 (GLOVE) ×4
GOWN STRL REUS W/TWL LRG LVL3 (GOWN DISPOSABLE) ×6 IMPLANT
GOWN STRL REUS W/TWL XL LVL3 (GOWN DISPOSABLE) ×6 IMPLANT
PACK ANTERIOR HIP CUSTOM (KITS) ×3 IMPLANT
STRIP CLOSURE SKIN 1/2X4 (GAUZE/BANDAGES/DRESSINGS) ×2 IMPLANT
SUT ETHIBOND NAB CT1 #1 30IN (SUTURE) ×3 IMPLANT
SUT MNCRL AB 4-0 PS2 18 (SUTURE) ×3 IMPLANT
SUT STRATAFIX 0 PDS 27 VIOLET (SUTURE) ×3
SUT VIC AB 2-0 CT1 27 (SUTURE) ×4
SUT VIC AB 2-0 CT1 TAPERPNT 27 (SUTURE) ×2 IMPLANT
SUTURE STRATFX 0 PDS 27 VIOLET (SUTURE) ×1 IMPLANT
SYR 50ML LL SCALE MARK (SYRINGE) IMPLANT
TRAY FOLEY W/METER SILVER 16FR (SET/KITS/TRAYS/PACK) ×3 IMPLANT
YANKAUER SUCT BULB TIP 10FT TU (MISCELLANEOUS) ×3 IMPLANT

## 2016-06-25 NOTE — Interval H&P Note (Signed)
History and Physical Interval Note:  06/25/2016 7:52 AM  [name]  has presented today for surgery, with the diagnosis of Osteoarthritis Left Hip  The various methods of treatment have been discussed with the patient and family. After consideration of risks, benefits and other options for treatment, the patient has consented to  Procedure(s): LEFT TOTAL HIP ARTHROPLASTY ANTERIOR APPROACH (Left) as a surgical intervention .  The patient's history has been reviewed, patient examined, no change in status, stable for surgery.  I have reviewed the patient's chart and labs.  Questions were answered to the patient's satisfaction.     [name]

## 2016-06-25 NOTE — Progress Notes (Signed)
Portable AP Pelvis X-ray done. 

## 2016-06-25 NOTE — Anesthesia Preprocedure Evaluation (Addendum)
Anesthesia Evaluation  Patient identified by MRN, date of birth, ID band Patient awake    Reviewed: Allergy & Precautions, NPO status , Patient's Chart, lab work & pertinent test results  Airway Mallampati: III  TM Distance: >3 FB Neck ROM: Full    Dental  (+) Missing, Chipped,    Pulmonary asthma , Current Smoker,    Pulmonary exam normal breath sounds clear to auscultation       Cardiovascular negative cardio ROS Normal cardiovascular exam Rhythm:Regular Rate:Normal     Neuro/Psych PSYCHIATRIC DISORDERS Anxiety Depression Bipolar Disorder    GI/Hepatic GERD  Medicated and Controlled,(+)     substance abuse  alcohol use, Hepatitis -, BUnder pain contract   Endo/Other  negative endocrine ROS  Renal/GU negative Renal ROS  negative genitourinary   Musculoskeletal  (+) Arthritis , Osteoarthritis,  OA left hip AVN left hip   Abdominal Normal abdominal exam  (+)   Peds  Hematology Thrombocytopenia   Anesthesia Other Findings   Reproductive/Obstetrics                            Lab Results  Component Value Date   WBC 8.3 06/19/2016   HGB 14.1 06/19/2016   HCT 42.2 06/19/2016   MCV 88.7 06/19/2016   PLT 83 (L) 06/19/2016     Chemistry      Component Value Date/Time   NA 139 06/19/2016 0930   K 4.2 06/19/2016 0930   CL 104 06/19/2016 0930   CO2 27 06/19/2016 0930   BUN 13 06/19/2016 0930   CREATININE 0.89 06/19/2016 0930      Component Value Date/Time   CALCIUM 9.0 06/19/2016 0930   ALKPHOS 84 06/19/2016 0930   AST 16 06/19/2016 0930   ALT 13 (L) 06/19/2016 0930   BILITOT 0.5 06/19/2016 0930     EKG: normal sinus rhythm, RBBB.  Anesthesia Physical Anesthesia Plan  ASA: III  Anesthesia Plan: General   Post-op Pain Management:    Induction: Intravenous  Airway Management Planned: Oral ETT  Additional Equipment:   Intra-op Plan: Utilization Of Total Body Hypothermia  per surgeon request  Post-operative Plan: Extubation in OR  Informed Consent: I have reviewed the patients History and Physical, chart, labs and discussed the procedure including the risks, benefits and alternatives for the proposed anesthesia with the patient or authorized representative who has indicated his/her understanding and acceptance.   Dental advisory given  Plan Discussed with: CRNA, Anesthesiologist and Surgeon  Anesthesia Plan Comments: (Repeat Plt 60k this am.)       Anesthesia Quick Evaluation

## 2016-06-25 NOTE — Anesthesia Postprocedure Evaluation (Signed)
Anesthesia Post Note  Patient: [name]  Procedure(s) Performed: Procedure(s) (LRB): LEFT TOTAL HIP ARTHROPLASTY ANTERIOR APPROACH (Left)  Patient location during evaluation: PACU Anesthesia Type: General Level of consciousness: awake and alert and oriented Pain management: pain level controlled Vital Signs Assessment: post-procedure vital signs reviewed and stable Respiratory status: spontaneous breathing, nonlabored ventilation, respiratory function stable and patient connected to nasal cannula oxygen Cardiovascular status: blood pressure returned to baseline and stable Postop Assessment: no signs of nausea or vomiting Anesthetic complications: no       Last Vitals:  Vitals:   06/25/16 1045 06/25/16 1100  BP: (!) 144/71 121/60  Pulse: 80 77  Resp: 13 (!) 8  Temp:      Last Pain:  Vitals:   06/25/16 1045  TempSrc:   PainSc: 4                  Kristin Barcus A.

## 2016-06-25 NOTE — H&P (View-Only) (Signed)
[name] DOB: 02-03-1956 Divorced / Language: English / Race: White Female Date of Admission:  06/25/2016 CC:  Left Hip Pain  History of Present Illness  The patient is a 61 year old female who comes in  for a preoperative History and Physical. The patient is scheduled for a left total hip arthroplasty (anterior) to be performed by Dr. Dione Plover. Aluisio, MD at [facility] on 06-25-2016. The patient is being followed for their left chronic hip pain. Current treatment includes: pain medications (Oxycodone, for all over problems). She had a MRI of the left hip preformed. There was concerned she had avascular necrosis, a possible small area of collapse of the femoral head. Her hip pain is progressively worsening. It is limiting her activities. It was found that she did have a large area of avascular necrosis, but also an area of collapse within this. There is some edema around that area. She has got avascular necrosis. At this point, the most predictable means of improving pain and function is total hip arthroplasty. The procedure, risks, potential complications and rehab course are discussed in detail and the patient elects to proceed. The goals of this procedure are decreased pain and increased function. We discussed the implications of that. She is familiar with it as she had hip replacement on the right in the past. We discussed that the most predictable means of improving her pain and function is going to be total hip arthroplasty. She is ready to proceed with surgery at this time. They have been treated conservatively in the past for the above stated problem and despite conservative measures, they continue to have progressive pain and severe functional limitations and dysfunction. They have failed non-operative management including home exercise, medications. It is felt that they would benefit from undergoing total joint replacement. Risks and benefits of the procedure have been discussed  with the patient and they elect to proceed with surgery. There are no active contraindications to surgery such as ongoing infection or rapidly progressive neurological disease.  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 Drug / Alcohol Addiction  Brother. Rheumatoid Arthritis  Maternal Grandfather.  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 06/28/2015 Under pain contract   Medication History BuPROPion HCl ER (SR) (150MG  Tablet ER 12HR, Oral two times daily) Active. Biotin (5000MCG Tablet, Oral) Active. OxyCODONE HCl (10MG  Tablet, Oral) Active. Montelukast Sodium (10MG  Tablet, Oral) Active. HydrOXYzine Pamoate (25MG  Capsule, Oral) Active. Furosemide (20MG  Tablet, Oral) Active. Super B Complex (Oral) Active. Vitamin D3 (2000UNIT Tablet, Oral) Active. Calcium + D (150-261-330MG -MG-IU Capsule, Oral) Active.  Past Surgical History Cataract Surgery  bilateral Colon Polyp Removal - Colonoscopy  Gallbladder Surgery  open Hip Fracture and Surgery  right Spinal Fusion  lower back; Dr. Arnoldo Morale L2-3-4 Spinal Surgery  Tonsillectomy  Pilonidal Cyst Removal  Tubal Ligation   Review of Systems  General Not Present- Chills, Fatigue, Fever, Memory Loss, Night Sweats, Weight Gain and Weight  Loss. Skin Not Present- Eczema, Hives, Itching, Lesions and Rash. HEENT  Not Present- Dentures, Double Vision, Headache, Hearing Loss, Tinnitus and Visual Loss. Respiratory Not Present- Allergies, Chronic Cough, Coughing up blood, Shortness of breath at rest and Shortness of breath with exertion. Cardiovascular Not Present- Chest Pain, Difficulty Breathing Lying Down, Murmur, Palpitations, Racing/skipping heartbeats and Swelling. Gastrointestinal Not Present- Abdominal Pain, Bloody Stool, Constipation, Diarrhea, Difficulty Swallowing, Heartburn, Jaundice, Loss of appetitie, Nausea and Vomiting. Female Genitourinary Not Present- Blood in Urine, Discharge, Flank Pain, Incontinence, Painful Urination, Urgency, Urinary frequency, Urinary Retention, Urinating at Night and Weak urinary stream. Musculoskeletal Present- Joint Pain. Not Present- Back Pain, Joint Swelling, Morning Stiffness, Muscle Pain, Muscle Weakness and Spasms. Neurological Not Present- Blackout spells, Difficulty with balance, Dizziness, Paralysis, Tremor and Weakness. Psychiatric Not Present- Insomnia.  Vitals 06/03/2016 11:09 AM Weight: 185 lb Height: 67in Weight was reported by patient. Height was reported by patient. Body Surface Area: 1.96 m Body Mass Index: 28.97 kg/m  Pulse: 72 (Regular)  Resp.: 14 (Unlabored)  BP: 132/72 (Sitting, Left Arm, Standard)  Physical Exam General Mental Status -Alert, cooperative and good historian. General Appearance-pleasant, Not in acute distress. Orientation-Oriented X3. Build & Nutrition-Well nourished and Well developed.  Head and Neck Head-normocephalic, atraumatic . Neck Global Assessment - supple, no bruit auscultated on the right, no bruit auscultated on the left.  Eye Vision-Wears corrective lenses. Pupil - Bilateral-Regular and Round. Motion - Bilateral-EOMI.  Chest and Lung Exam Auscultation Breath sounds - clear at anterior chest wall and clear at posterior chest wall. Adventitious sounds - No Adventitious  sounds.  Cardiovascular Auscultation Rhythm - Regular rate and rhythm. Heart Sounds - S1 WNL and S2 WNL. Murmurs & Other Heart Sounds - Auscultation of the heart reveals - No Murmurs.  Abdomen Palpation/Percussion Tenderness - Abdomen is non-tender to palpation. Rigidity (guarding) - Abdomen is soft. Auscultation Auscultation of the abdomen reveals - Bowel sounds normal.  Female Genitourinary Note: Not done, not pertinent to present illness  Musculoskeletal Note: Left hip can be flexed to about 110, rotate in 10, out 30, abduct to 30 with discomfort on range of motion.  IMAGING She does have a large area of avascular necrosis, but also an area of collapse within this. There is some edema around that area.  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 Plan HHPT.  PCP: Dr. Britt Bottom  IV TXA  Anesthesia Issues: None  Patient was instructed on what medications to stop prior to surgery.  Signed electronically by Joelene Millin, III PA-C

## 2016-06-25 NOTE — Anesthesia Procedure Notes (Signed)
Procedure Name: Intubation Date/Time: 06/25/2016 8:33 AM Performed by: Talbot Grumbling Pre-anesthesia Checklist: Patient identified, Emergency Drugs available, Suction available and Patient being monitored Patient Re-evaluated:Patient Re-evaluated prior to inductionOxygen Delivery Method: Circle system utilized Preoxygenation: Pre-oxygenation with 100% oxygen Intubation Type: IV induction Ventilation: Mask ventilation without difficulty Laryngoscope Size: Glidescope (attempt view with Mac 3 without success) Grade View: Grade II Tube type: Oral Tube size: 7.0 mm Number of attempts: 1 Airway Equipment and Method: Stylet and Video-laryngoscopy Placement Confirmation: ETT inserted through vocal cords under direct vision,  positive ETCO2 and breath sounds checked- equal and bilateral Secured at: 21 cm Tube secured with: Tape Dental Injury: Teeth and Oropharynx as per pre-operative assessment

## 2016-06-25 NOTE — Evaluation (Signed)
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  Pt should progress to dc home with assist of family/friends.    Follow Up Recommendations Home health PT    Equipment Recommendations  None recommended by PT    Recommendations for Other Services OT consult     Precautions / Restrictions Precautions Precautions: Fall Restrictions Weight Bearing Restrictions: No Other Position/Activity Restrictions: WBAT      Mobility  Bed Mobility Overal bed mobility: Needs Assistance Bed Mobility: Supine to Sit     Supine to sit: Min assist     General bed mobility comments: cues for sequence and use of R LE to self assist  Transfers Overall transfer level: Needs assistance Equipment used: Rolling walker (2 wheeled) Transfers: Sit to/from Stand Sit to Stand: Min assist         General transfer comment: cues for LE management and use of UEs to self assist  Ambulation/Gait Ambulation/Gait assistance: Min assist Ambulation Distance (Feet): 62 Feet Assistive device: Rolling walker (2 wheeled) Gait Pattern/deviations: Step-to pattern;Decreased step length - right;Decreased step length - left;Shuffle;Trunk flexed Gait velocity: decr Gait velocity interpretation: Below normal speed for age/gender General Gait Details: cues for LE sequence, posture and position from [employer]            Wheelchair Mobility    Modified Rankin (Stroke Patients Only)       Balance                                             Pertinent Vitals/Pain Pain Assessment: 0-10 Pain Score: 6  Pain Location: L hip Pain Descriptors / Indicators: Aching;Sore Pain  Intervention(s): Limited activity within patient's tolerance;Monitored during session;Premedicated before session;Ice applied    Home Living Family/patient expects to be discharged to:: Private residence Living Arrangements: Alone Available Help at Discharge: Family Type of Home: House Home Access: Stairs to enter Entrance Stairs-Rails: Left Entrance Stairs-Number of Steps: 2 Home Layout: One level Home Equipment: Fisher - 2 wheels;Bedside commode      Prior Function Level of Independence: Independent;Independent with assistive device(s)               Hand Dominance        Extremity/Trunk Assessment   Upper Extremity Assessment Upper Extremity Assessment: Overall WFL for tasks assessed    Lower Extremity Assessment Lower Extremity Assessment: LLE deficits/detail    Cervical / Trunk Assessment Cervical / Trunk Assessment: Normal  Communication   Communication: No difficulties  Cognition Arousal/Alertness: Awake/alert Behavior During Therapy: WFL for tasks assessed/performed Overall Cognitive Status: Within Functional Limits for tasks assessed                                        General Comments      Exercises Total Joint Exercises Ankle Circles/Pumps: AROM;Both;15 reps;Supine   Assessment/Plan    PT Assessment Patient needs continued PT services  PT Problem List Decreased strength;Decreased range of motion;Decreased activity tolerance;Decreased mobility;Decreased knowledge of  use of DME;Pain       PT Treatment Interventions DME instruction;Gait training;Stair training;Functional mobility training;Therapeutic activities;Therapeutic exercise;Patient/family education    PT Goals (Current goals can be found in the Care Plan section)  Acute Rehab PT Goals Patient Stated Goal: Regain IND PT Goal Formulation: With patient Time For Goal Achievement: 06/28/16 Potential to Achieve Goals: Good    Frequency 7X/week   Barriers to discharge         Co-evaluation               AM-PAC PT "6 Clicks" Daily Activity  Outcome Measure Difficulty turning over in bed (including adjusting bedclothes, sheets and blankets)?: A Little Difficulty moving from lying on back to sitting on the side of the bed? : A Little Difficulty sitting down on and standing up from a chair with arms (e.g., wheelchair, bedside commode, etc,.)?: A Little Help needed moving to and from a bed to chair (including a wheelchair)?: A Little Help needed walking in hospital room?: A Little Help needed climbing 3-5 steps with a railing? : A Little 6 Click Score: 18    End of Session Equipment Utilized During Treatment: Gait belt Activity Tolerance: Patient tolerated treatment well Patient left: in chair;with call bell/phone within reach;with chair alarm set Nurse Communication: Mobility status PT Visit Diagnosis: Unsteadiness on feet (R26.81);Difficulty in walking, not elsewhere classified (R26.2)    Time: 1583-0940 PT Time Calculation (min) (ACUTE ONLY): 25 min   Charges:   PT Evaluation $PT Eval Low Complexity: 1 Procedure PT Treatments $Gait Training: 8-22 mins   PT G Codes:        Pg [phone]   Teofilo Lupinacci 06/25/2016, 3:34 PM

## 2016-06-25 NOTE — Transfer of Care (Signed)
Immediate Anesthesia Transfer of Care Note  Patient: [name]  Procedure(s) Performed: Procedure(s): LEFT TOTAL HIP ARTHROPLASTY ANTERIOR APPROACH (Left)  Patient Location: PACU  Anesthesia Type:General  Level of Consciousness:  sedated, patient cooperative and responds to stimulation  Airway & Oxygen Therapy:Patient Spontanous Breathing and Patient connected to face mask oxgen  Post-op Assessment:  Report given to PACU RN and Post -op Vital signs reviewed and stable  Post vital signs:  Reviewed and stable  Last Vitals:  Vitals:   06/25/16 0613  BP: 131/78  Pulse: 71  Resp: 18  Temp: 37 C    Complications: No apparent anesthesia complications

## 2016-06-25 NOTE — Op Note (Signed)
OPERATIVE REPORT- TOTAL HIP ARTHROPLASTY   PREOPERATIVE DIAGNOSIS: Osteoarthritis of the Left hip.   POSTOPERATIVE DIAGNOSIS: Osteoarthritis of the Left  hip.   PROCEDURE: Left total hip arthroplasty, anterior approach.   SURGEON: Gaynelle Arabian, MD   ASSISTANT: Arlee Muslim, PA-C  ANESTHESIA:  General  ESTIMATED BLOOD LOSS:-400 ml  DRAINS: Hemovac x1.   COMPLICATIONS: None   CONDITION: PACU - hemodynamically stable.   BRIEF CLINICAL NOTE: [name] is a 61 y.o. female who has advanced end-  stage arthritis of their Left  hip with progressively worsening pain and  dysfunction.The patient has failed nonoperative management and presents for  total hip arthroplasty.   PROCEDURE IN DETAIL: After successful administration of spinal  anesthetic, the traction boots for the [facility] bed were placed on both  feet and the patient was placed onto the [facility] bed, boots placed into the leg  holders. The Left hip was then isolated from the perineum with plastic  drapes and prepped and draped in the usual sterile fashion. ASIS and  greater trochanter were marked and a oblique incision was made, starting  at about 1 cm lateral and 2 cm distal to the ASIS and coursing towards  the anterior cortex of the femur. The skin was cut with a 10 blade  through subcutaneous tissue to the level of the fascia overlying the  tensor fascia lata muscle. The fascia was then incised in line with the  incision at the junction of the anterior third and posterior 2/3rd. The  muscle was teased off the fascia and then the interval between the TFL  and the rectus was developed. The Hohmann retractor was then placed at  the top of the femoral neck over the capsule. The vessels overlying the  capsule were cauterized and the fat on top of the capsule was removed.  A Hohmann retractor was then placed anterior underneath the rectus  femoris to give exposure to the entire anterior capsule. A T-shaped   capsulotomy was performed. The edges were tagged and the femoral head  was identified.       Osteophytes are removed off the superior acetabulum.  The femoral neck was then cut in situ with an oscillating saw. Traction  was then applied to the left lower extremity utilizing the [facility]  traction. The femoral head was then removed. Retractors were placed  around the acetabulum and then circumferential removal of the labrum was  performed. Osteophytes were also removed. Reaming starts at 45 mm to  medialize and  Increased in 2 mm increments to 49 mm. We reamed in  approximately 40 degrees of abduction, 20 degrees anteversion. A 50 mm  pinnacle acetabular shell was then impacted in anatomic position under  fluoroscopic guidance with excellent purchase. We did not need to place  any additional dome screws. A 32 mm neutral + 4 marathon liner was then  placed into the acetabular shell.       The femoral lift was then placed along the lateral aspect of the femur  just distal to the vastus ridge. The leg was  externally rotated and capsule  was stripped off the inferior aspect of the femoral neck down to the  level of the lesser trochanter, this was done with electrocautery. The femur was lifted after this was performed. The  leg was then placed in an extended and adducted position essentially delivering the femur. We also removed the capsule superiorly and the piriformis from the piriformis fossa to  gain excellent exposure of the  proximal femur. Rongeur was used to remove some cancellous bone to get  into the lateral portion of the proximal femur for placement of the  initial starter reamer. The starter broaches was placed  the starter broach  and was shown to go down the center of the canal. Broaching  with the  Corail system was then performed starting at size 8, coursing  Up to size 11. A size 11 had excellent torsional and rotational  and axial stability. The trial high offset neck was then  placed  with a 32 + 1 trial head. The hip was then reduced. We confirmed that  the stem was in the canal both on AP and lateral x-rays. It also has excellent sizing. The hip was reduced with outstanding stability through full extension and full external rotation.. AP pelvis was taken and the leg lengths were measured and found to be equal. Hip was then dislocated again and the femoral head and neck removed. The  femoral broach was removed. Size 11 Corail stem with a high offset  neck was then impacted into the femur following native anteversion. Has  excellent purchase in the canal. Excellent torsional and rotational and  axial stability. It is confirmed to be in the canal on AP and lateral  fluoroscopic views. The 32 + 1 ceramic head was placed and the hip  reduced with outstanding stability. Again AP pelvis was taken and it  confirmed that the leg lengths were equal. The wound was then copiously  irrigated with saline solution and the capsule reattached and repaired  with Ethibond suture. 30 ml of .25% Bupivicaine was  injected into the capsule and into the edge of the tensor fascia lata as well as subcutaneous tissue. The fascia overlying the tensor fascia lata was then closed with a running #1 V-Loc. Subcu was closed with interrupted 2-0 Vicryl and subcuticular running 4-0 Monocryl. Incision was cleaned  and dried. Steri-Strips and a bulky sterile dressing applied. Hemovac  drain was hooked to suction and then the patient was awakened and transported to  recovery in stable condition.        Please note that a surgical assistant was a medical necessity for this procedure to perform it in a safe and expeditious manner. Assistant was necessary to provide appropriate retraction of vital neurovascular structures and to prevent femoral fracture and allow for anatomic placement of the prosthesis.  Gaynelle Arabian, M.D.

## 2016-06-25 NOTE — Progress Notes (Signed)
X-ray results noted 

## 2016-06-26 LAB — BASIC METABOLIC PANEL
Anion gap: 6 (ref 5–15)
BUN: 13 mg/dL (ref 6–20)
CALCIUM: 8.3 mg/dL — AB (ref 8.9–10.3)
CO2: 25 mmol/L (ref 22–32)
Chloride: 107 mmol/L (ref 101–111)
Creatinine, Ser: 0.67 mg/dL (ref 0.44–1.00)
GFR calc Af Amer: >60 mL/min (ref >60)
GLUCOSE: 110 mg/dL — AB (ref 65–99)
POTASSIUM: 4.2 mmol/L (ref 3.5–5.1)
Sodium: 138 mmol/L (ref 135–145)

## 2016-06-26 LAB — CBC
HEMATOCRIT: 34 % — AB (ref 36.0–46.0)
Hemoglobin: 11.3 g/dL — ABNORMAL LOW (ref 12.0–15.0)
MCH: 29.1 pg (ref 26.0–34.0)
MCHC: 33.2 g/dL (ref 30.0–36.0)
MCV: 87.6 fL (ref 78.0–100.0)
PLATELETS: 71 10*3/uL — AB (ref 150–400)
RBC: 3.88 MIL/uL (ref 3.87–5.11)
RDW: 12.9 % (ref 11.5–15.5)
WBC: 19.5 10*3/uL — ABNORMAL HIGH (ref 4.0–10.5)

## 2016-06-26 MED ORDER — OXYCODONE HCL 5 MG PO TABS: 5.0000 mg | ORAL_TABLET | ORAL | 0 refills | Status: DC | PRN

## 2016-06-26 MED ORDER — RIVAROXABAN 10 MG PO TABS: 10.0000 mg | ORAL_TABLET | Freq: Every day | ORAL | 0 refills | Status: DC

## 2016-06-26 MED ORDER — TRAMADOL HCL 50 MG PO TABS: 50.0000 mg | ORAL_TABLET | Freq: Four times a day (QID) | ORAL | 0 refills | Status: DC | PRN

## 2016-06-26 MED ORDER — METHOCARBAMOL 500 MG PO TABS: 500.0000 mg | ORAL_TABLET | Freq: Four times a day (QID) | ORAL | 0 refills | Status: DC | PRN

## 2016-06-26 NOTE — Progress Notes (Signed)
   Subjective: 1 Day Post-Op Procedure(s) (LRB): LEFT TOTAL HIP ARTHROPLASTY ANTERIOR APPROACH (Left) Patient reports pain as mild.   Patient seen in rounds by Dr. Wynelle Link. Patient is well, and has had no acute complaints or problems.  Did well with therapy on the day of surgery and walked over 60 feet. Patient is ready to go home later today after therapy. Arrangements being made for discharge.  Objective: Vital signs in last 24 hours: Temp:  [97.4 F (36.3 C)-98.3 F (36.8 C)] 97.9 F (36.6 C) (05/03 0550) Pulse Rate:  [75-101] 77 (05/03 0550) Resp:  [8-18] 18 (05/03 0550) BP: (100-151)/(58-83) 114/64 (05/03 0550) SpO2:  [96 %-100 %] 100 % (05/03 0550) Weight:  [80.3 kg (177 lb)] 80.3 kg (177 lb) (05/02 1200)  Intake/Output from previous day:  Intake/Output Summary (Last 24 hours) at 06/26/16 0842 Last data filed at 06/26/16 0840  Gross per 24 hour  Intake           3512.5 ml  Output             4930 ml  Net          -1417.5 ml    Intake/Output this shift: Total I/O In: 240 [P.O.:240] Out: 150 [Urine:150]  Labs:  Recent Labs  06/25/16 0727 06/26/16 0502  HGB 11.4* 11.3*    Recent Labs  06/25/16 0727 06/26/16 0502  WBC 9.4 19.5*  RBC 3.93 3.88  HCT 34.7* 34.0*  PLT 60* 71*    Recent Labs  06/26/16 0502  NA 138  K 4.2  CL 107  CO2 25  BUN 13  CREATININE 0.67  GLUCOSE 110*  CALCIUM 8.3*   No results for input(s): LABPT, INR in the last 72 hours.  EXAM: General - Patient is Alert, Appropriate and Oriented Extremity - Neurovascular intact Sensation intact distally Intact pulses distally Dorsiflexion/Plantar flexion intact Dressing - clean, dry Motor Function - intact, moving foot and toes well on exam.  HV came out.  Assessment/Plan: 1 Day Post-Op Procedure(s) (LRB): LEFT TOTAL HIP ARTHROPLASTY ANTERIOR APPROACH (Left) Procedure(s) (LRB): LEFT TOTAL HIP ARTHROPLASTY ANTERIOR APPROACH (Left) Past Medical History:  Diagnosis Date  .  Abnormal mammogram of left breast 07/09/2015  . Anxiety   . Arthritis   . Asthma   . Bipolar disorder ([place])    on meds  . Breast lump   . Depression    no meds  . Hepatitis    hep b  . Hyperlipemia    Principal Problem:   OA (osteoarthritis) of hip  Estimated body mass index is 27.72 kg/m as calculated from the following:   Height as of this encounter: 5\' 7"  (1.702 m).   Weight as of this encounter: 80.3 kg (177 lb). Up with therapy Discharge home with home health Diet - Cardiac diet Follow up - in 2 weeks Activity - WBAT Disposition - Home Condition Upon Discharge - Stable D/C Meds - See DC Summary DVT Prophylaxis - Yoe, PA-C Orthopaedic Surgery 06/26/2016, 8:42 AM

## 2016-06-26 NOTE — Evaluation (Signed)
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 Pt will lives alone, will initially have assistance from friends/family upon return home. Pt currently requires MinGuard Assist for most ADLs and functional mobility, with increased assist for LB dressing and LB bathing. Educated on AE and compensatory techniques for completing ADLs. Pt will benefit from continued OT services while in acute setting to increase safety and independence with ADLs and functional mobility. Goals are for MinA to supervision.     Follow Up Recommendations  No OT follow up;Supervision/Assistance - 24 hour (initially )    Equipment Recommendations  Tub/shower bench           Precautions / Restrictions Precautions Precautions: Fall Restrictions Weight Bearing Restrictions: No Other Position/Activity Restrictions: WBAT      Mobility Bed Mobility Overal bed mobility: Needs Assistance Bed Mobility: Supine to Sit     Supine to sit: Supervision;HOB elevated        Transfers Overall transfer level: Needs assistance Equipment used: Rolling walker (2 wheeled) Transfers: Sit to/from Stand Sit to Stand: Min guard         General transfer comment: cues for use of UEs     Balance Overall balance assessment: No apparent balance deficits (not formally assessed)                                         ADL either performed or assessed with clinical judgement   ADL Overall ADL's : Needs assistance/impaired Eating/Feeding: Independent;Sitting   Grooming: Wash/dry hands;Min guard;Standing   Upper Body Bathing: Min guard;Sitting   Lower Body Bathing: Minimal assistance;Sit to/from stand Lower Body Bathing Details (indicate cue type and  reason): educated on AE for completing LB bathing  Upper Body Dressing : Sitting;Set up   Lower Body Dressing: Minimal assistance;Sit to/from stand Lower Body Dressing Details (indicate cue type and reason): educated on AE for completing task  Toilet Transfer: Min guard;Ambulation;BSC;RW Toilet Transfer Details (indicate cue type and reason): BSC over toilet  Toileting- Clothing Manipulation and Hygiene: Min guard;Sit to/from stand   Tub/ Shower Transfer: Min guard;Cueing for safety;Cueing for sequencing;Ambulation;Tub bench;Rolling walker   Functional mobility during ADLs: Min guard;Rolling walker General ADL Comments: completed room level functional mobility, toilet transfer, tub bench transfer, educated on AE and compensatory techniques for completing ADLs                          Pertinent Vitals/Pain Pain Assessment: Faces Faces Pain Scale: Hurts a little bit Pain Location: L hip Pain Descriptors / Indicators: Aching;Sore Pain Intervention(s): Limited activity within patient's tolerance;Monitored during session;Premedicated before session;Repositioned          Extremity/Trunk Assessment Upper Extremity Assessment Upper Extremity Assessment: Overall WFL for tasks assessed           Communication Communication Communication: No difficulties   Cognition Arousal/Alertness: Awake/alert Behavior During Therapy: WFL for tasks assessed/performed Overall Cognitive Status: Within Functional Limits for tasks assessed  General Comments: Pt slightly impulsive (used to moving fast), requires verbal cues during functional mobility to increase safety    General Comments                  Home Living Family/patient expects to be discharged to:: Private residence Living Arrangements: Alone Available Help at Discharge: Family Type of Home: [place]: One level     Bathroom Shower/Tub: Animal nutritionist: [place]: Environmental consultant - 2 wheels;Bedside commode          Prior Functioning/Environment Level of Independence: Independent;Independent with assistive device(s)                 OT Problem List: Decreased strength;Decreased activity tolerance;Decreased knowledge of use of DME or AE      OT Treatment/Interventions: Self-care/ADL training;DME and/or AE instruction;Patient/family education;Therapeutic activities    OT Goals(Current goals can be found in the care plan section) Acute Rehab OT Goals Patient Stated Goal: Regain IND; 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  OT Goal Formulation: With patient Time For Goal Achievement: 07/03/16 Potential to Achieve Goals: Good ADL Goals Pt Will Perform Lower Body Dressing: with min assist;with adaptive equipment;sit to/from stand Pt Will Perform Tub/Shower Transfer: with supervision;ambulating;tub bench;rolling walker  OT Frequency: Min 2X/week                             AM-PAC PT "6 Clicks" Daily Activity     Outcome Measure Help from another person eating meals?: None Help from another person taking care of personal grooming?: A Little Help from another person toileting, which includes using toliet, bedpan, or urinal?: A Little Help from another person bathing (including washing, rinsing, drying)?: A Little Help from another person to put on and taking off regular upper body clothing?: A Little Help from another person to put on and taking off regular lower body clothing?: A Lot 6 Click Score: 18   End of Session Equipment Utilized During Treatment: Gait belt;Rolling walker Nurse Communication: Other (comment) (DME needs )  Activity Tolerance: Patient tolerated treatment well Patient left: in bed;with call bell/phone within reach  OT Visit Diagnosis: Muscle weakness (generalized) (M62.81)                Time: 7026-3785 OT Time Calculation (min): 25 min Charges:  OT General  Charges $OT Visit: 1 Procedure OT Evaluation $OT Eval Low Complexity: 1 Procedure G-Codes:     Lou Cal, OT Pager [phone] 06/26/2016   Raymondo Band 06/26/2016, 10:11 AM

## 2016-06-26 NOTE — Progress Notes (Signed)
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  Assisted back to bed. Pt ready for D/C to home  Follow Up Recommendations  Home health PT     Equipment Recommendations  None recommended by PT    Recommendations for Other Services       Precautions / Restrictions Precautions Precautions: Fall Restrictions Weight Bearing Restrictions: No Other Position/Activity Restrictions: WBAT    Mobility  Bed Mobility Overal bed mobility: Needs Assistance Bed Mobility: Supine to Sit     Supine to sit: Supervision;HOB elevated     General bed mobility comments: increased time  Transfers Overall transfer level: Needs assistance Equipment used: Rolling walker (2 wheeled) Transfers: Sit to/from Stand Sit to Stand: Min guard         General transfer comment: cues for use of UEs   Ambulation/Gait Ambulation/Gait assistance: Supervision;Min guard Ambulation Distance (Feet): 110 Feet Assistive device: Rolling walker (2 wheeled) Gait Pattern/deviations: Step-to pattern;Decreased step length - right;Decreased step length - left;Shuffle;Trunk flexed Gait velocity: decr   General Gait Details: cues for LE sequence, posture and position from RW   Stairs Stairs: Yes   Stair Management: One rail Left;Step to pattern;Forwards Number of Stairs: 2 General stair comments: <25% VC's on proper sequencing and safety  Wheelchair Mobility    Modified Rankin (Stroke Patients Only)       Balance                                            Cognition Arousal/Alertness: Awake/alert Behavior During Therapy: WFL for tasks assessed/performed Overall Cognitive Status: Within Functional Limits for  tasks assessed                                        Exercises      General Comments        Pertinent Vitals/Pain Pain Assessment: 0-10 Pain Score: 3  Pain Location: L hip Pain Descriptors / Indicators: Aching;Sore Pain Intervention(s): Monitored during session;Ice applied;Repositioned    Home Living                      Prior Function            PT Goals (current goals can now be found in the care plan section)      Frequency    7X/week      PT Plan Current plan remains appropriate    Co-evaluation              AM-PAC PT "6 Clicks" Daily Activity  Outcome Measure  Difficulty turning over in bed (including adjusting bedclothes, sheets and blankets)?: A Little Difficulty moving from lying on back to sitting on the side of the bed? : A Little Difficulty sitting down on and standing up from a chair with arms (e.g., wheelchair, bedside commode, etc,.)?: A Little Help needed moving to and from a bed to chair (including a wheelchair)?: A Little Help needed walking in hospital room?: A Little  Help needed climbing 3-5 steps with a railing? : A Little 6 Click Score: 18    End of Session Equipment Utilized During Treatment: Gait belt Activity Tolerance: Patient tolerated treatment well Patient left: in chair;with call bell/phone within reach;with chair alarm set Nurse Communication: Mobility status PT Visit Diagnosis: Unsteadiness on feet (R26.81);Difficulty in walking, not elsewhere classified (R26.2)     Time: 8102-5486 PT Time Calculation (min) (ACUTE ONLY): 16 min  Charges:  $Gait Training: 8-22 mins $Therapeutic Exercise: 8-22 mins                    G Codes:       Rica Koyanagi  PTA [facility]  Acute  Rehab Pager      [phone]

## 2016-06-26 NOTE — Discharge Summary (Signed)
Physician Discharge Summary   Patient ID: [name] MRN: [id number] DOB/AGE: [birth year] 61 y.o.  Admit date: 06/25/2016 Discharge date: 06-26-2016  Primary Diagnosis:  Osteoarthritis of the Left hip.  Admission Diagnoses:  Past Medical History:  Diagnosis Date  . Abnormal mammogram of left breast 07/09/2015  . Anxiety   . Arthritis   . Asthma   . Bipolar disorder ([place])    on meds  . Breast lump   . Depression    no meds  . Hepatitis    hep b  . Hyperlipemia    Discharge Diagnoses:   Principal Problem:   OA (osteoarthritis) of hip  Estimated body mass index is 27.72 kg/m as calculated from the following:   Height as of this encounter: 5' 7"  (1.702 m).   Weight as of this encounter: 80.3 kg (177 lb).  Procedure(s) (LRB): LEFT TOTAL HIP ARTHROPLASTY ANTERIOR APPROACH (Left)   Consults: None  HPI: [name] is a 61 y.o. female who has advanced end-  stage arthritis of their Left  hip with progressively worsening pain and  dysfunction.The patient has failed nonoperative management and presents for  total hip arthroplasty.   Laboratory Data: Admission on 06/25/2016  Component Date Value Ref Range Status  . WBC 06/25/2016 9.4  4.0 - 10.5 K/uL Final  . RBC 06/25/2016 3.93  3.87 - 5.11 MIL/uL Final  . Hemoglobin 06/25/2016 11.4* 12.0 - 15.0 g/dL Final  . HCT 06/25/2016 34.7* 36.0 - 46.0 % Final  . MCV 06/25/2016 88.3  78.0 - 100.0 fL Final  . MCH 06/25/2016 29.0  26.0 - 34.0 pg Final  . MCHC 06/25/2016 32.9  30.0 - 36.0 g/dL Final  . RDW 06/25/2016 12.9  11.5 - 15.5 % Final  . Platelets 06/25/2016 60* 150 - 400 K/uL Final   Comment: REPEATED TO VERIFY SPECIMEN CHECKED FOR CLOTS PLATELET COUNT CONFIRMED BY SMEAR   . WBC 06/26/2016 19.5* 4.0 - 10.5 K/uL Final  . RBC 06/26/2016 3.88  3.87 - 5.11 MIL/uL Final  . Hemoglobin 06/26/2016 11.3* 12.0 - 15.0 g/dL Final  . HCT 06/26/2016 34.0* 36.0 - 46.0 % Final  . MCV 06/26/2016 87.6  78.0 - 100.0 fL Final  . MCH  06/26/2016 29.1  26.0 - 34.0 pg Final  . MCHC 06/26/2016 33.2  30.0 - 36.0 g/dL Final  . RDW 06/26/2016 12.9  11.5 - 15.5 % Final  . Platelets 06/26/2016 71* 150 - 400 K/uL Final  . Sodium 06/26/2016 138  135 - 145 mmol/L Final  . Potassium 06/26/2016 4.2  3.5 - 5.1 mmol/L Final  . Chloride 06/26/2016 107  101 - 111 mmol/L Final  . CO2 06/26/2016 25  22 - 32 mmol/L Final  . Glucose, Bld 06/26/2016 110* 65 - 99 mg/dL Final  . BUN 06/26/2016 13  6 - 20 mg/dL Final  . Creatinine, Ser 06/26/2016 0.67  0.44 - 1.00 mg/dL Final  . Calcium 06/26/2016 8.3* 8.9 - 10.3 mg/dL Final  . GFR calc non Af Amer 06/26/2016 >60  >60 mL/min Final  . GFR calc Af Amer 06/26/2016 >60  >60 mL/min Final   Comment: (NOTE) The eGFR has been calculated using the CKD EPI equation. This calculation has not been validated in all clinical situations. eGFR's persistently <60 mL/min signify possible Chronic Kidney Disease.   Georgiann Hahn gap 06/26/2016 6  5 - 15 Final  Hospital Outpatient Visit on 06/19/2016  Component Date Value Ref Range Status  . aPTT 06/19/2016 26  24 - 36 seconds Final  . WBC 06/19/2016 8.3  4.0 - 10.5 K/uL Final  . RBC 06/19/2016 4.76  3.87 - 5.11 MIL/uL Final  . Hemoglobin 06/19/2016 14.1  12.0 - 15.0 g/dL Final  . HCT 06/19/2016 42.2  36.0 - 46.0 % Final  . MCV 06/19/2016 88.7  78.0 - 100.0 fL Final  . MCH 06/19/2016 29.6  26.0 - 34.0 pg Final  . MCHC 06/19/2016 33.4  30.0 - 36.0 g/dL Final  . RDW 06/19/2016 13.0  11.5 - 15.5 % Final  . Platelets 06/19/2016 83* 150 - 400 K/uL Final   Comment: REPEATED TO VERIFY SPECIMEN CHECKED FOR CLOTS PLATELET COUNT CONFIRMED BY SMEAR   . Sodium 06/19/2016 139  135 - 145 mmol/L Final  . Potassium 06/19/2016 4.2  3.5 - 5.1 mmol/L Final  . Chloride 06/19/2016 104  101 - 111 mmol/L Final  . CO2 06/19/2016 27  22 - 32 mmol/L Final  . Glucose, Bld 06/19/2016 98  65 - 99 mg/dL Final  . BUN 06/19/2016 13  6 - 20 mg/dL Final  . Creatinine, Ser 06/19/2016 0.89   0.44 - 1.00 mg/dL Final  . Calcium 06/19/2016 9.0  8.9 - 10.3 mg/dL Final  . Total Protein 06/19/2016 6.7  6.5 - 8.1 g/dL Final  . Albumin 06/19/2016 3.9  3.5 - 5.0 g/dL Final  . AST 06/19/2016 16  15 - 41 U/L Final  . ALT 06/19/2016 13* 14 - 54 U/L Final  . Alkaline Phosphatase 06/19/2016 84  38 - 126 U/L Final  . Total Bilirubin 06/19/2016 0.5  0.3 - 1.2 mg/dL Final  . GFR calc non Af Amer 06/19/2016 >60  >60 mL/min Final  . GFR calc Af Amer 06/19/2016 >60  >60 mL/min Final   Comment: (NOTE) The eGFR has been calculated using the CKD EPI equation. This calculation has not been validated in all clinical situations. eGFR's persistently <60 mL/min signify possible Chronic Kidney Disease.   . Anion gap 06/19/2016 8  5 - 15 Final  . Prothrombin Time 06/19/2016 12.4  11.4 - 15.2 seconds Final  . INR 06/19/2016 0.93   Final  . ABO/RH(D) 06/19/2016 A NEG   Final  . Antibody Screen 06/19/2016 NEG   Final  . Sample Expiration 06/19/2016 06/28/2016   Final  . Extend sample reason 06/19/2016 NO TRANSFUSIONS OR PREGNANCY IN THE PAST 3 MONTHS   Final  . MRSA, PCR 06/19/2016 NEGATIVE  NEGATIVE Final  . Staphylococcus aureus 06/19/2016 NEGATIVE  NEGATIVE Final   Comment:        The Xpert SA Assay (FDA approved for NASAL specimens in patients over 4 years of age), is one component of a comprehensive surveillance program.  Test performance has been validated by [facility] for patients greater than or equal to 35 year old. It is not intended to diagnose infection nor to guide or monitor treatment.      X-Rays:Dg Pelvis Portable  Result Date: 06/25/2016 CLINICAL DATA:  Left hip surgery. EXAM: PORTABLE PELVIS 1-2 VIEWS COMPARISON:  MRI 08/03/2012.  Pelvic series  07/17/2003. FINDINGS: Total left hip replacement. Hardware intact near anatomic alignment. Prior right hip replacement. Hardware intact. Anatomic alignment. No acute bony abnormality. IMPRESSION: Total left hip replacement with  anatomic alignment. Electronically Signed   By: Marcello Moores  Register   On: 06/25/2016 10:43   Dg C-arm 1-60 Min-no Report  Result Date: 06/25/2016 Fluoroscopy was utilized by the requesting physician.  No radiographic interpretation.    EKG: Orders  placed or performed during the hospital encounter of 01/16/15  . EKG 12-Lead  . EKG 12-Lead     Hospital Course: Patient was admitted to [facility] and taken to the OR and underwent the above state procedure without complications.  Patient tolerated the procedure well and was later transferred to the recovery room and then to the orthopaedic floor for postoperative care.  They were given PO and IV analgesics for pain control following their surgery.  They were given 24 hours of postoperative antibiotics of  Anti-infectives    Start     Dose/Rate Route Frequency Ordered Stop   06/25/16 1430  ceFAZolin (ANCEF) IVPB 2g/100 mL premix     2 g 200 mL/hr over 30 Minutes Intravenous Every 6 hours 06/25/16 1205 06/25/16 2109   06/25/16 0641  ceFAZolin (ANCEF) 2-4 GM/100ML-% IVPB    Comments:  Demeshia, [place]   : cabinet override      06/25/16 0641 06/25/16 0830   06/25/16 0610  ceFAZolin (ANCEF) IVPB 2g/100 mL premix     2 g 200 mL/hr over 30 Minutes Intravenous On call to O.R. 06/25/16 9767 06/25/16 0845     and started on DVT prophylaxis in the form of Xarelto.   PT and OT were ordered for total hip protocol.  The patient was allowed to be WBAT with therapy. Discharge planning was consulted to help with postop disposition and equipment needs.  Patient had a good night on the evening of surgery.  She got up and walked with therapy following surgery.  Worked with therapy on day one.  Hemovac drain was pulled without difficulty. Patient was seen in rounds by Dr. Wynelle Link and felt to be good to go home.  After meeting therapy goals, she was ready to go home.  Discharge home with home health Diet - Cardiac diet Follow up - in 2 weeks Activity -  WBAT Disposition - Home Condition Upon Discharge - improving D/C Meds - See DC Summary DVT Prophylaxis - Xarelto   Discharge Instructions    Call MD / Call 911    Complete by:  As directed    If you experience chest pain or shortness of breath, CALL 911 and be transported to the hospital emergency room.  If you develope a fever above 101 F, pus (white drainage) or increased drainage or redness at the wound, or calf pain, call your surgeon's office.   Change dressing    Complete by:  As directed    You may change your dressing dressing daily with sterile 4 x 4 inch gauze dressing and paper tape.  Do not submerge the incision under water.   Constipation Prevention    Complete by:  As directed    Drink plenty of fluids.  Prune juice may be helpful.  You may use a stool softener, such as Colace (over the counter) 100 mg twice a day.  Use MiraLax (over the counter) for constipation as needed.   Diet - low sodium heart healthy    Complete by:  As directed    Discharge instructions    Complete by:  As directed    Pick up stool softner and laxative for home use following surgery while on pain medications. Do not submerge incision under water. Please use good hand washing techniques while changing dressing each day. May shower starting three days after surgery. Please use a clean towel to pat the incision dry following showers. Continue to use ice for pain and swelling after surgery.  Do not use any lotions or creams on the incision until instructed by your surgeon.  Wear both TED hose on both legs during the day every day for three weeks, but may have off at night at home.  Postoperative Constipation Protocol  Constipation - defined medically as fewer than three stools per week and severe constipation as less than one stool per week.  One of the most common issues patients have following surgery is constipation.  Even if you have a regular bowel pattern at home, your normal regimen is  likely to be disrupted due to multiple reasons following surgery.  Combination of anesthesia, postoperative narcotics, change in appetite and fluid intake all can affect your bowels.  In order to avoid complications following surgery, here are some recommendations in order to help you during your recovery period.  Colace (docusate) - Pick up an over-the-counter form of Colace or another stool softener and take twice a day as long as you are requiring postoperative pain medications.  Take with a full glass of water daily.  If you experience loose stools or diarrhea, hold the colace until you stool forms back up.  If your symptoms do not get better within 1 week or if they get worse, check with your doctor.  Dulcolax (bisacodyl) - Pick up over-the-counter and take as directed by the product packaging as needed to assist with the movement of your bowels.  Take with a full glass of water.  Use this product as needed if not relieved by Colace only.   MiraLax (polyethylene glycol) - Pick up over-the-counter to have on hand.  MiraLax is a solution that will increase the amount of water in your bowels to assist with bowel movements.  Take as directed and can mix with a glass of water, juice, soda, coffee, or tea.  Take if you go more than two days without a movement. Do not use MiraLax more than once per day. Call your doctor if you are still constipated or irregular after using this medication for 7 days in a row.  If you continue to have problems with postoperative constipation, please contact the office for further assistance and recommendations.  If you experience "the worst abdominal pain ever" or develop nausea or vomiting, please contact the office immediatly for further recommendations for treatment.   Take Xarelto for two and a half more weeks, then discontinue Xarelto. Once the patient has completed the blood thinner regimen, then take a Baby 81 mg Aspirin daily for three more weeks.   Do not sit on  low chairs, stoools or toilet seats, as it may be difficult to get up from low surfaces    Complete by:  As directed    Driving restrictions    Complete by:  As directed    No driving until released by the physician.   Increase activity slowly as tolerated    Complete by:  As directed    Lifting restrictions    Complete by:  As directed    No lifting until released by the physician.   Patient may shower    Complete by:  As directed    You may shower without a dressing once there is no drainage.  Do not wash over the wound.  If drainage remains, do not shower until drainage stops.   TED hose    Complete by:  As directed    Use stockings (TED hose) for 3 weeks on both leg(s).  You may remove them at night  for sleeping.   Weight bearing as tolerated    Complete by:  As directed    Laterality:  left   Extremity:  Lower     Allergies as of 06/26/2016   No Known Allergies     Medication List    STOP taking these medications   CALCIUM PO   SUPER B COMPLEX PO   Vitamin D3 5000 units Caps     TAKE these medications   baclofen 10 MG tablet Commonly known as:  LIORESAL Take 10 mg by mouth 2 (two) times daily.   BREO ELLIPTA 100-25 MCG/INH Aepb Generic drug:  fluticasone furoate-vilanterol Inhale 1 puff by mouth as needed for shortness of breath   methocarbamol 500 MG tablet Commonly known as:  ROBAXIN Take 1 tablet (500 mg total) by mouth every 6 (six) hours as needed for muscle spasms.   oxyCODONE 5 MG immediate release tablet Commonly known as:  Oxy IR/ROXICODONE Take 1-2 tablets (5-10 mg total) by mouth every 4 (four) hours as needed for moderate pain or severe pain.   OxyCODONE ER 13.5 MG C12a Take 13.5 mg by mouth every 12 (twelve) hours.   polyethylene glycol packet Commonly known as:  MIRALAX / GLYCOLAX Take 17 g by mouth daily as needed for mild constipation.   PROAIR HFA 108 (90 Base) MCG/ACT inhaler Generic drug:  albuterol inhale TWO puffs by MOUTH every  FOUR TO SIX hours as needed for SHORTNESS OF BREATH   ranitidine 300 MG tablet Commonly known as:  ZANTAC Take 300 mg by mouth daily as needed for heartburn.   rivaroxaban 10 MG Tabs tablet Commonly known as:  XARELTO Take 1 tablet (10 mg total) by mouth daily with breakfast. Take Xarelto for two and a half more weeks following discharge from the hospital, then discontinue Xarelto. Once the patient has completed the blood thinner regimen, then take a Baby 81 mg Aspirin daily for three more weeks. Start taking on:  06/27/2016   SINGULAIR 10 MG tablet Generic drug:  montelukast Take 10 mg by mouth at bedtime.   traMADol 50 MG tablet Commonly known as:  ULTRAM Take 1-2 tablets (50-100 mg total) by mouth every 6 (six) hours as needed for moderate pain.      Follow-up Information    [name], MD. Schedule an appointment as soon as possible for a visit on 07/08/2016.   Specialty:  Orthopedic Surgery Contact information: [address] [place] [address] [phone]           Signed: Arlee Muslim, PA-C Orthopaedic Surgery 06/26/2016, 8:48 AM

## 2016-06-26 NOTE — Progress Notes (Signed)
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  Practiced stairs then performed all THR TE's following HEP.  Instructed on Proper tech, freq as well as use of ICE.    Follow Up Recommendations  Home health PT     Equipment Recommendations  None recommended by PT    Recommendations for Other Services       Precautions / Restrictions Precautions Precautions: Fall Restrictions Weight Bearing Restrictions: No Other Position/Activity Restrictions: WBAT    Mobility  Bed Mobility Overal bed mobility: Needs Assistance Bed Mobility: Supine to Sit     Supine to sit: Supervision;HOB elevated     General bed mobility comments: increased time  Transfers Overall transfer level: Needs assistance Equipment used: Rolling walker (2 wheeled) Transfers: Sit to/from Stand Sit to Stand: Min guard         General transfer comment: cues for use of UEs   Ambulation/Gait Ambulation/Gait assistance: Supervision;Min guard Ambulation Distance (Feet): 85 Feet Assistive device: Rolling walker (2 wheeled) Gait Pattern/deviations: Step-to pattern;Decreased step length - right;Decreased step length - left;Shuffle;Trunk flexed Gait velocity: decr   General Gait Details: cues for LE sequence, posture and position from RW   Stairs Stairs: Yes   Stair Management: One rail Left;Step to pattern;Forwards Number of Stairs: 2 General stair comments: 25% VC's on proper sequencing and safety  Wheelchair Mobility    Modified Rankin (Stroke Patients Only)       Balance                                            Cognition Arousal/Alertness: Awake/alert Behavior During Therapy: WFL for tasks  assessed/performed Overall Cognitive Status: Within Functional Limits for tasks assessed                                        Exercises   Total Hip Replacement TE's 10 reps ankle pumps 10 reps knee presses 10 reps heel slides 10 reps SAQ's 10 reps ABD Followed by ICE     General Comments        Pertinent Vitals/Pain Pain Assessment: 0-10 Pain Score: 3  Pain Location: L hip Pain Descriptors / Indicators: Aching;Sore Pain Intervention(s): Monitored during session;Ice applied;Repositioned    Home Living                      Prior Function            PT Goals (current goals can now be found in the care plan section)      Frequency    7X/week      PT Plan Current plan remains appropriate    Co-evaluation              AM-PAC PT "6 Clicks" Daily Activity  Outcome Measure  Difficulty turning over in bed (including adjusting bedclothes, sheets and blankets)?: A Little Difficulty moving from lying on back to sitting on the side of the bed? : A Little Difficulty sitting down on  and standing up from a chair with arms (e.g., wheelchair, bedside commode, etc,.)?: A Little Help needed moving to and from a bed to chair (including a wheelchair)?: A Little Help needed walking in hospital room?: A Little Help needed climbing 3-5 steps with a railing? : A Little 6 Click Score: 18    End of Session Equipment Utilized During Treatment: Gait belt Activity Tolerance: Patient tolerated treatment well Patient left: in chair;with call bell/phone within reach;with chair alarm set Nurse Communication: Mobility status PT Visit Diagnosis: Unsteadiness on feet (R26.81);Difficulty in walking, not elsewhere classified (R26.2)     Time: 7116-5790 PT Time Calculation (min) (ACUTE ONLY): 29 min  Charges:  $Gait Training: 8-22 mins $Therapeutic Exercise: 8-22 mins                    G Codes:       {Simone Tuckey  PTA [facility]  Acute  Rehab Pager       [phone]

## 2016-06-26 NOTE — Progress Notes (Signed)
Discharge planning, spoke with patient at bedside. Have chosen [facility] for [facility] PT. Contacted [facility] for referral. Needs tub bench, requesting rollator but has RW, contacted [facility] to deliver to room. [phone]

## 2016-07-03 DIAGNOSIS — Z8619 Personal history of other infectious and parasitic diseases: Secondary | ICD-10-CM | POA: Diagnosis not present

## 2016-07-03 DIAGNOSIS — Z96643 Presence of artificial hip joint, bilateral: Secondary | ICD-10-CM | POA: Diagnosis not present

## 2016-07-03 DIAGNOSIS — Z471 Aftercare following joint replacement surgery: Secondary | ICD-10-CM | POA: Diagnosis not present

## 2016-07-03 DIAGNOSIS — J45909 Unspecified asthma, uncomplicated: Secondary | ICD-10-CM | POA: Diagnosis not present

## 2016-07-03 DIAGNOSIS — Z7901 Long term (current) use of anticoagulants: Secondary | ICD-10-CM | POA: Diagnosis not present

## 2016-07-03 DIAGNOSIS — Z79891 Long term (current) use of opiate analgesic: Secondary | ICD-10-CM | POA: Diagnosis not present

## 2016-07-03 DIAGNOSIS — Z87891 Personal history of nicotine dependence: Secondary | ICD-10-CM | POA: Diagnosis not present

## 2016-07-03 DIAGNOSIS — Z981 Arthrodesis status: Secondary | ICD-10-CM | POA: Diagnosis not present

## 2016-07-04 DIAGNOSIS — J4541 Moderate persistent asthma with (acute) exacerbation: Secondary | ICD-10-CM | POA: Diagnosis not present

## 2016-07-04 DIAGNOSIS — E782 Mixed hyperlipidemia: Secondary | ICD-10-CM | POA: Diagnosis not present

## 2016-07-04 DIAGNOSIS — M25552 Pain in left hip: Secondary | ICD-10-CM | POA: Diagnosis not present

## 2016-07-09 DIAGNOSIS — Z7901 Long term (current) use of anticoagulants: Secondary | ICD-10-CM | POA: Diagnosis not present

## 2016-07-09 DIAGNOSIS — Z981 Arthrodesis status: Secondary | ICD-10-CM | POA: Diagnosis not present

## 2016-07-09 DIAGNOSIS — Z87891 Personal history of nicotine dependence: Secondary | ICD-10-CM | POA: Diagnosis not present

## 2016-07-09 DIAGNOSIS — Z471 Aftercare following joint replacement surgery: Secondary | ICD-10-CM | POA: Diagnosis not present

## 2016-07-09 DIAGNOSIS — Z96643 Presence of artificial hip joint, bilateral: Secondary | ICD-10-CM | POA: Diagnosis not present

## 2016-07-09 DIAGNOSIS — Z79891 Long term (current) use of opiate analgesic: Secondary | ICD-10-CM | POA: Diagnosis not present

## 2016-07-09 DIAGNOSIS — Z8619 Personal history of other infectious and parasitic diseases: Secondary | ICD-10-CM | POA: Diagnosis not present

## 2016-07-09 DIAGNOSIS — J45909 Unspecified asthma, uncomplicated: Secondary | ICD-10-CM | POA: Diagnosis not present

## 2016-07-10 DIAGNOSIS — J441 Chronic obstructive pulmonary disease with (acute) exacerbation: Secondary | ICD-10-CM | POA: Diagnosis not present

## 2016-07-10 DIAGNOSIS — E784 Other hyperlipidemia: Secondary | ICD-10-CM | POA: Diagnosis not present

## 2016-07-11 DIAGNOSIS — Z7901 Long term (current) use of anticoagulants: Secondary | ICD-10-CM | POA: Diagnosis not present

## 2016-07-11 DIAGNOSIS — Z981 Arthrodesis status: Secondary | ICD-10-CM | POA: Diagnosis not present

## 2016-07-11 DIAGNOSIS — Z79891 Long term (current) use of opiate analgesic: Secondary | ICD-10-CM | POA: Diagnosis not present

## 2016-07-11 DIAGNOSIS — Z471 Aftercare following joint replacement surgery: Secondary | ICD-10-CM | POA: Diagnosis not present

## 2016-07-11 DIAGNOSIS — Z96643 Presence of artificial hip joint, bilateral: Secondary | ICD-10-CM | POA: Diagnosis not present

## 2016-07-11 DIAGNOSIS — Z8619 Personal history of other infectious and parasitic diseases: Secondary | ICD-10-CM | POA: Diagnosis not present

## 2016-07-11 DIAGNOSIS — Z87891 Personal history of nicotine dependence: Secondary | ICD-10-CM | POA: Diagnosis not present

## 2016-07-11 DIAGNOSIS — J45909 Unspecified asthma, uncomplicated: Secondary | ICD-10-CM | POA: Diagnosis not present

## 2016-07-14 DIAGNOSIS — Z96643 Presence of artificial hip joint, bilateral: Secondary | ICD-10-CM | POA: Diagnosis not present

## 2016-07-14 DIAGNOSIS — Z7901 Long term (current) use of anticoagulants: Secondary | ICD-10-CM | POA: Diagnosis not present

## 2016-07-14 DIAGNOSIS — J45909 Unspecified asthma, uncomplicated: Secondary | ICD-10-CM | POA: Diagnosis not present

## 2016-07-14 DIAGNOSIS — Z87891 Personal history of nicotine dependence: Secondary | ICD-10-CM | POA: Diagnosis not present

## 2016-07-14 DIAGNOSIS — Z79891 Long term (current) use of opiate analgesic: Secondary | ICD-10-CM | POA: Diagnosis not present

## 2016-07-14 DIAGNOSIS — Z981 Arthrodesis status: Secondary | ICD-10-CM | POA: Diagnosis not present

## 2016-07-14 DIAGNOSIS — Z8619 Personal history of other infectious and parasitic diseases: Secondary | ICD-10-CM | POA: Diagnosis not present

## 2016-07-14 DIAGNOSIS — Z471 Aftercare following joint replacement surgery: Secondary | ICD-10-CM | POA: Diagnosis not present

## 2016-07-16 DIAGNOSIS — Z471 Aftercare following joint replacement surgery: Secondary | ICD-10-CM | POA: Diagnosis not present

## 2016-07-16 DIAGNOSIS — Z96643 Presence of artificial hip joint, bilateral: Secondary | ICD-10-CM | POA: Diagnosis not present

## 2016-07-16 DIAGNOSIS — Z7901 Long term (current) use of anticoagulants: Secondary | ICD-10-CM | POA: Diagnosis not present

## 2016-07-16 DIAGNOSIS — J45909 Unspecified asthma, uncomplicated: Secondary | ICD-10-CM | POA: Diagnosis not present

## 2016-07-16 DIAGNOSIS — Z981 Arthrodesis status: Secondary | ICD-10-CM | POA: Diagnosis not present

## 2016-07-16 DIAGNOSIS — Z87891 Personal history of nicotine dependence: Secondary | ICD-10-CM | POA: Diagnosis not present

## 2016-07-16 DIAGNOSIS — Z79891 Long term (current) use of opiate analgesic: Secondary | ICD-10-CM | POA: Diagnosis not present

## 2016-07-16 DIAGNOSIS — Z8619 Personal history of other infectious and parasitic diseases: Secondary | ICD-10-CM | POA: Diagnosis not present

## 2016-09-12 DIAGNOSIS — J441 Chronic obstructive pulmonary disease with (acute) exacerbation: Secondary | ICD-10-CM | POA: Diagnosis not present

## 2016-09-12 DIAGNOSIS — E784 Other hyperlipidemia: Secondary | ICD-10-CM | POA: Diagnosis not present

## 2016-12-02 DIAGNOSIS — J0111 Acute recurrent frontal sinusitis: Secondary | ICD-10-CM | POA: Diagnosis not present

## 2016-12-02 DIAGNOSIS — Z0000 Encounter for general adult medical examination without abnormal findings: Secondary | ICD-10-CM | POA: Diagnosis not present

## 2016-12-02 DIAGNOSIS — E782 Mixed hyperlipidemia: Secondary | ICD-10-CM | POA: Diagnosis not present

## 2016-12-02 DIAGNOSIS — J4541 Moderate persistent asthma with (acute) exacerbation: Secondary | ICD-10-CM | POA: Diagnosis not present

## 2016-12-02 DIAGNOSIS — Z1389 Encounter for screening for other disorder: Secondary | ICD-10-CM | POA: Diagnosis not present

## 2016-12-24 DIAGNOSIS — J441 Chronic obstructive pulmonary disease with (acute) exacerbation: Secondary | ICD-10-CM | POA: Diagnosis not present

## 2016-12-24 DIAGNOSIS — E782 Mixed hyperlipidemia: Secondary | ICD-10-CM | POA: Diagnosis not present

## 2017-04-16 DIAGNOSIS — N3001 Acute cystitis with hematuria: Secondary | ICD-10-CM | POA: Diagnosis not present

## 2017-04-16 DIAGNOSIS — Z0000 Encounter for general adult medical examination without abnormal findings: Secondary | ICD-10-CM | POA: Diagnosis not present

## 2017-04-16 DIAGNOSIS — E782 Mixed hyperlipidemia: Secondary | ICD-10-CM | POA: Diagnosis not present

## 2017-04-16 DIAGNOSIS — J4541 Moderate persistent asthma with (acute) exacerbation: Secondary | ICD-10-CM | POA: Diagnosis not present

## 2017-09-23 DIAGNOSIS — J441 Chronic obstructive pulmonary disease with (acute) exacerbation: Secondary | ICD-10-CM | POA: Diagnosis not present

## 2017-10-13 DIAGNOSIS — E782 Mixed hyperlipidemia: Secondary | ICD-10-CM | POA: Diagnosis not present

## 2017-10-13 DIAGNOSIS — J4541 Moderate persistent asthma with (acute) exacerbation: Secondary | ICD-10-CM | POA: Diagnosis not present

## 2017-10-13 DIAGNOSIS — K210 Gastro-esophageal reflux disease with esophagitis: Secondary | ICD-10-CM | POA: Diagnosis not present

## 2018-01-05 IMAGING — US US BREAST BX W LOC DEV 1ST LESION IMG BX SPEC US GUIDE*L*
1 series · 10 of 10 positions shown · non-contrast
Comparison: Previous exam(s).

ADDENDUM:
Final pathology report of a left breast core needle biopsy 12
o'clock position shows vascular proliferation. Relatively
well-circumscribed vascular proliferation characterized by capillary
size vessels without significant atypia. The morphologic features
favor hemangioma. In discussion with the pathologist, given the
cellularity of the lesion, surgical excision is suggested. If the
patient is unable to undergo surgical excision, then close follow-up
would be advised.

I discussed pathology results with Mrs. Aujla by telephone today. She
is aware that surgical excision is recommended. She expresses that
she would like surgery to be performed after Easter. She has done
very well after the biopsy, and has no complaints regarding the
biopsy site. Our office will contact Dr. [REDACTED] regarding
the pathology results and the recommendation for surgical excision.
CLINICAL DATA: 5 mm mass [DATE] position left breast at least 6 cm
above the nipple. Ultrasound-guided biopsy was recommended.
EXAM:
ULTRASOUND GUIDED LEFT BREAST CORE NEEDLE BIOPSY

[Series 1: us breast bx w loc dev 1st lesion img bx spec us g · 0.07mm/px · 10 of 10 slices shown]
[im 1/10]
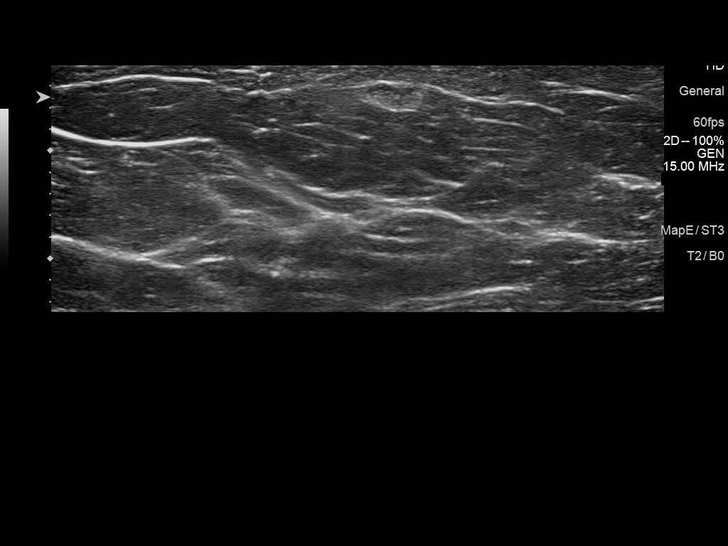
[im 2/10]
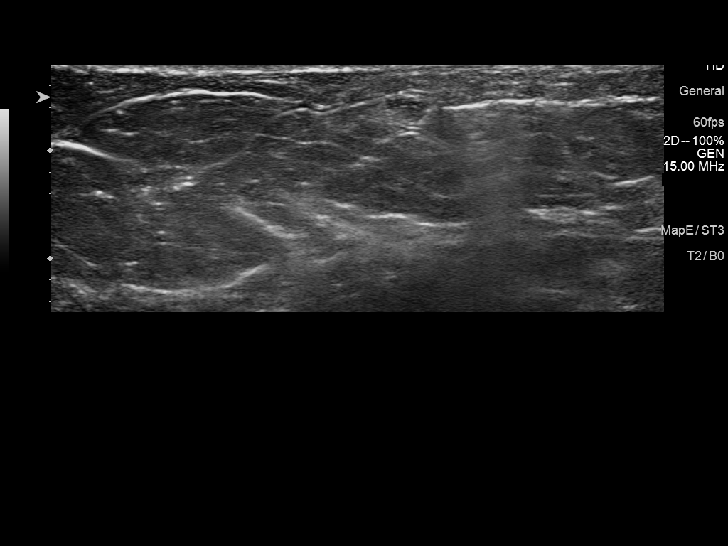
[im 3/10]
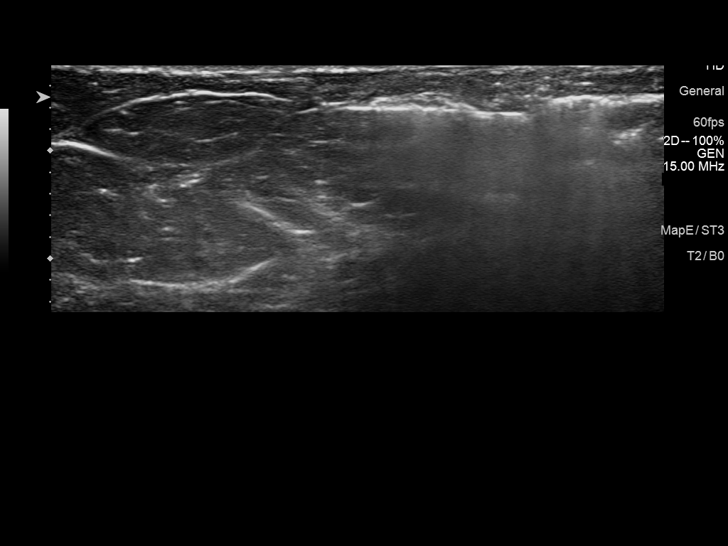
[im 4/10]
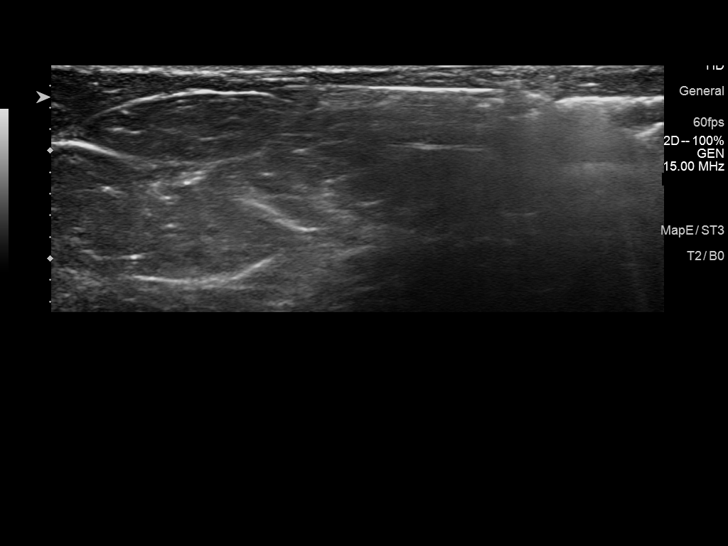
[im 5/10]
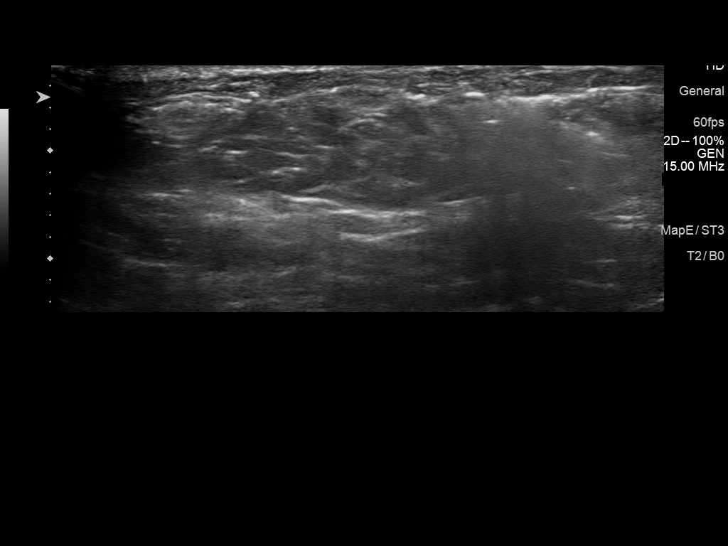
[im 6/10]
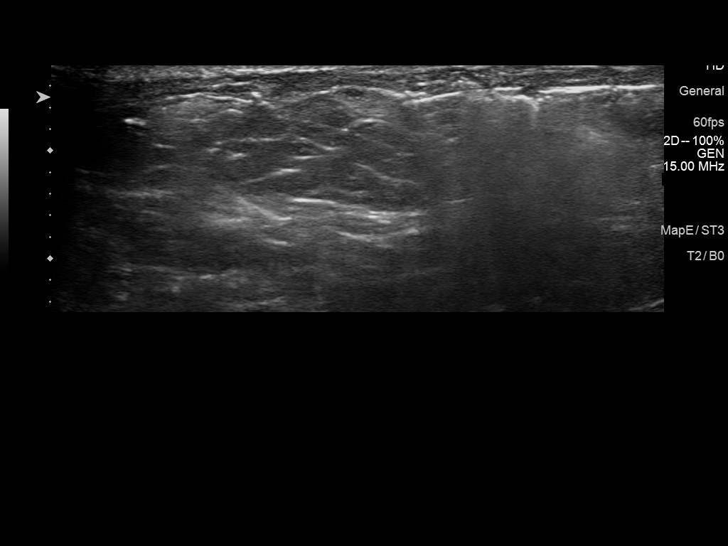
[im 7/10]
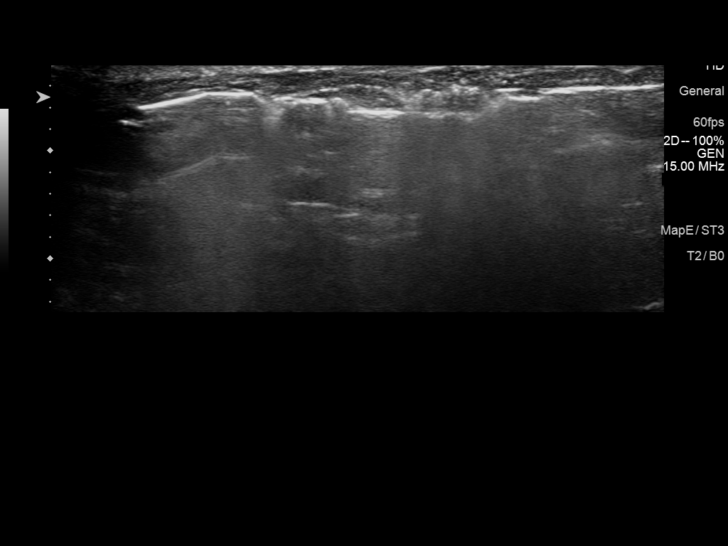
[im 8/10]
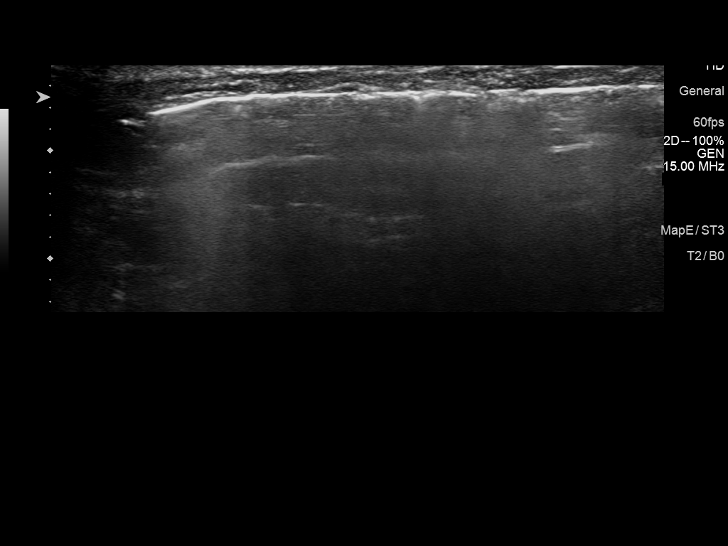
[im 9/10]
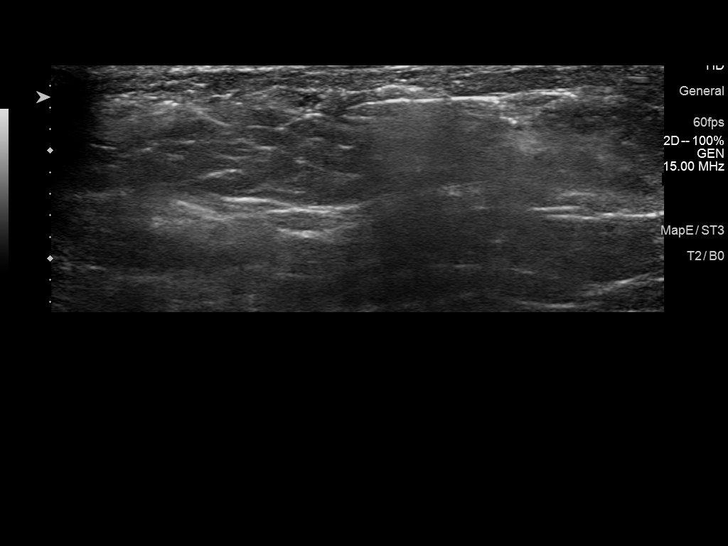
[im 10/10]
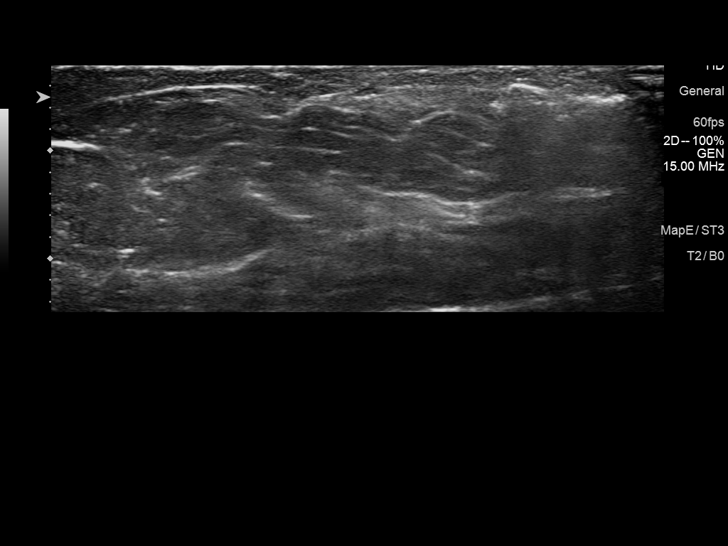

[10 of 10 positions shown; findings below may reference images not displayed]

PROCEDURE:
I met with the patient and we discussed the procedure of
ultrasound-guided biopsy, including benefits and alternatives. We
discussed the high likelihood of a successful procedure. We
discussed the risks of the procedure including infection, bleeding,
tissue injury, clip migration, and inadequate sampling. Informed
written consent was given. The usual time-out protocol was performed
immediately prior to the procedure.

Using sterile technique and 1% Lidocaine as local anesthetic, under
direct ultrasound visualization, a 12 gauge vacuum-assisted device
was used to perform biopsy of a 5 mm slightly hypoechoic oval nodule
using a lateral to medial approach. At the conclusion of the
procedure, a ribbon shaped tissue marker clip was deployed into the
biopsy cavity. Follow-up 2-view mammogram was performed and dictated
separately.
IMPRESSION: Ultrasound-guided biopsy of the left breast. No apparent
complications.

## 2018-01-12 DIAGNOSIS — K210 Gastro-esophageal reflux disease with esophagitis: Secondary | ICD-10-CM | POA: Diagnosis not present

## 2018-01-12 DIAGNOSIS — J4541 Moderate persistent asthma with (acute) exacerbation: Secondary | ICD-10-CM | POA: Diagnosis not present

## 2018-01-12 DIAGNOSIS — E782 Mixed hyperlipidemia: Secondary | ICD-10-CM | POA: Diagnosis not present

## 2018-02-08 ENCOUNTER — Other Ambulatory Visit: Payer: Self-pay | Admitting: Obstetrics and Gynecology

## 2018-02-08 DIAGNOSIS — Z1231 Encounter for screening mammogram for malignant neoplasm of breast: Secondary | ICD-10-CM

## 2018-02-22 DIAGNOSIS — E782 Mixed hyperlipidemia: Secondary | ICD-10-CM | POA: Diagnosis not present

## 2018-02-22 DIAGNOSIS — J4541 Moderate persistent asthma with (acute) exacerbation: Secondary | ICD-10-CM | POA: Diagnosis not present

## 2018-02-22 DIAGNOSIS — K210 Gastro-esophageal reflux disease with esophagitis: Secondary | ICD-10-CM | POA: Diagnosis not present

## 2018-03-18 ENCOUNTER — Ambulatory Visit: Payer: Medicare Other

## 2018-03-18 IMAGING — US US BREAST*L* LIMITED INC AXILLA
1 series · 7 of 7 positions shown · non-contrast
Comparison: Previous exam(s).

CLINICAL DATA: Screening recall for possible left breast mass.

EXAM:
2D DIGITAL DIAGNOSTIC UNILATERAL LEFT MAMMOGRAM WITH CAD AND ADJUNCT
TOMO
LEFT BREAST ULTRASOUND

[Series 1: us breast*left* limited inc axilla · 0.07mm/px · 7 of 7 slices shown]
[im 1/7]
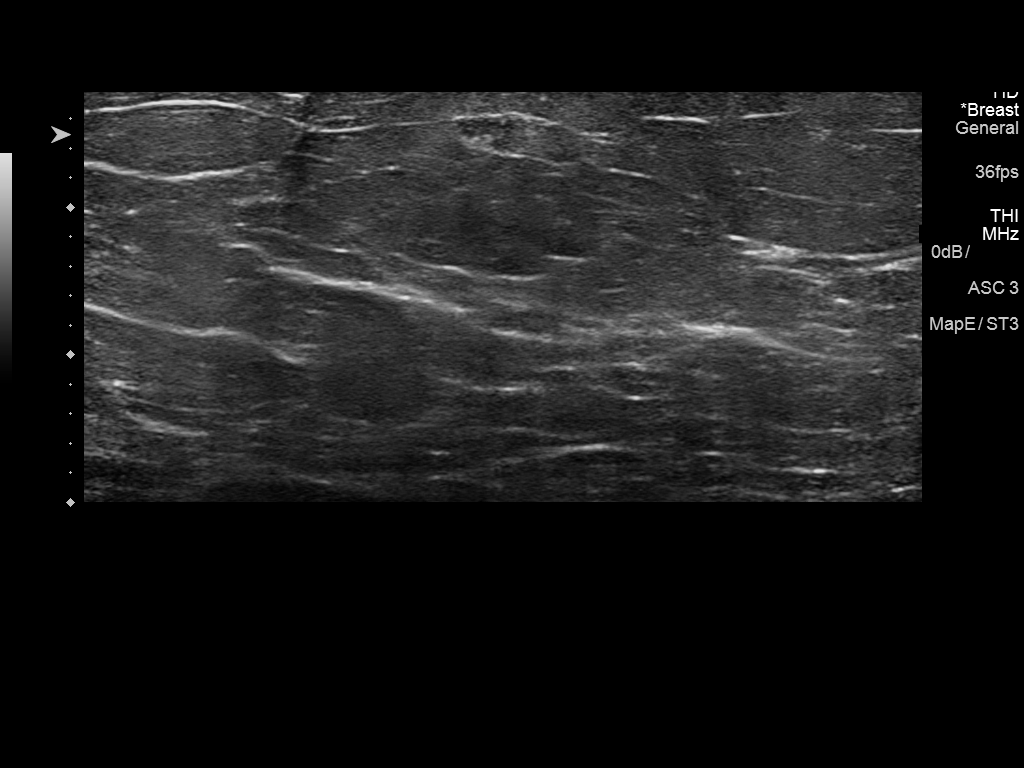
[im 2/7]
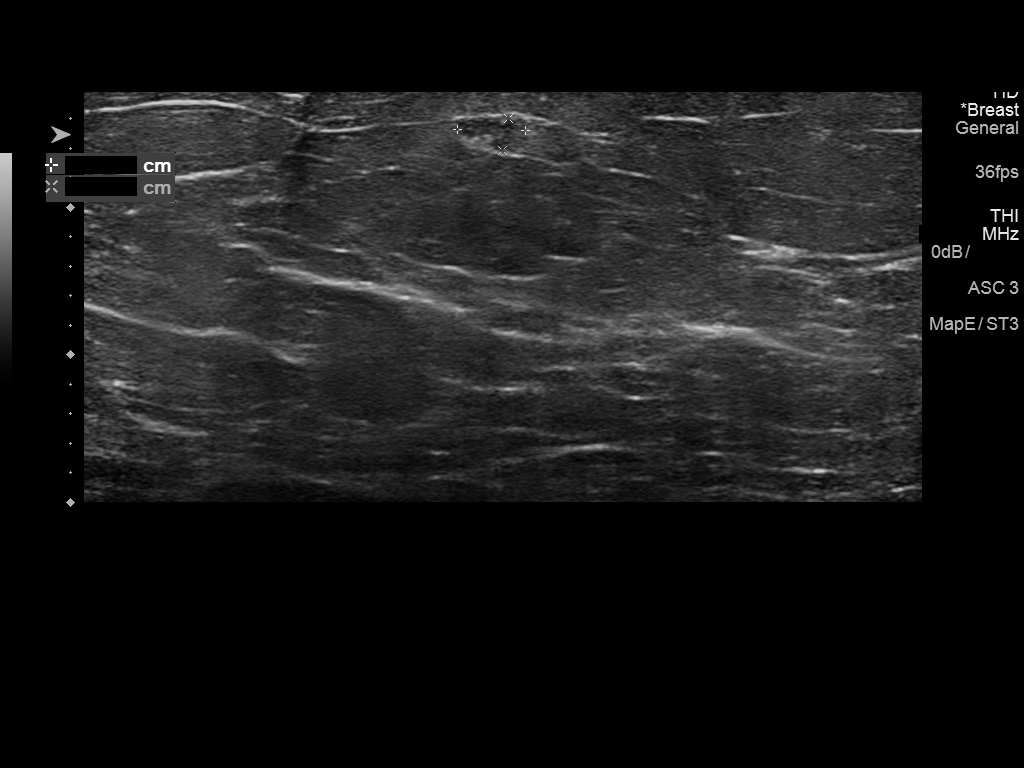
[im 3/7]
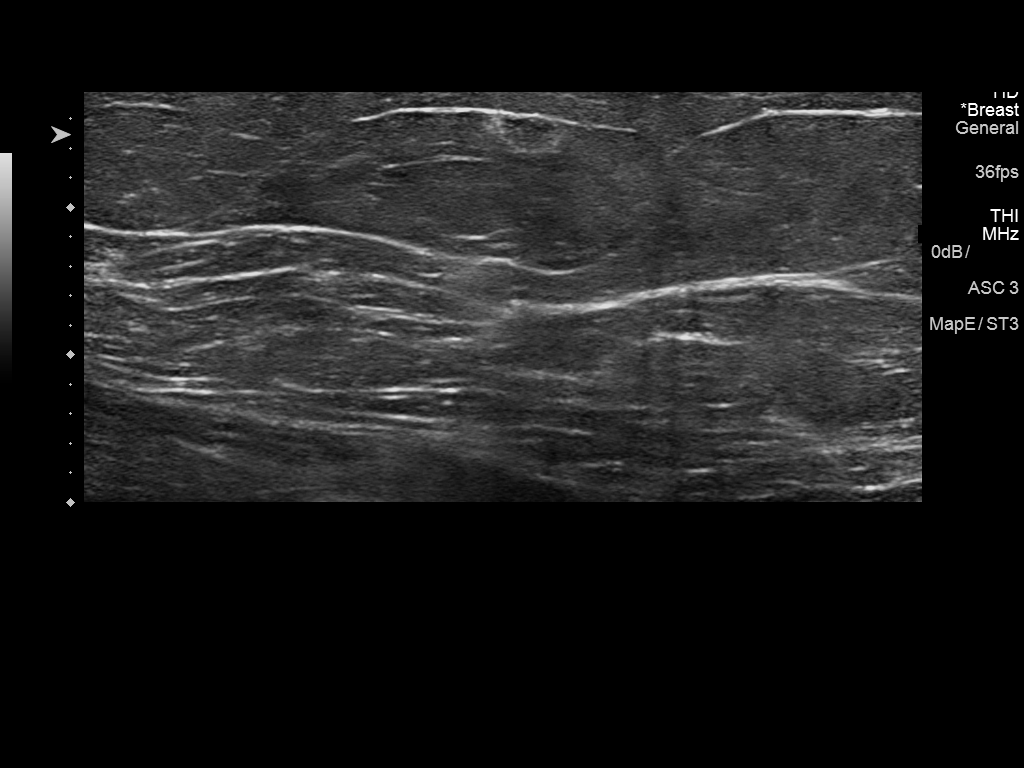
[im 4/7]
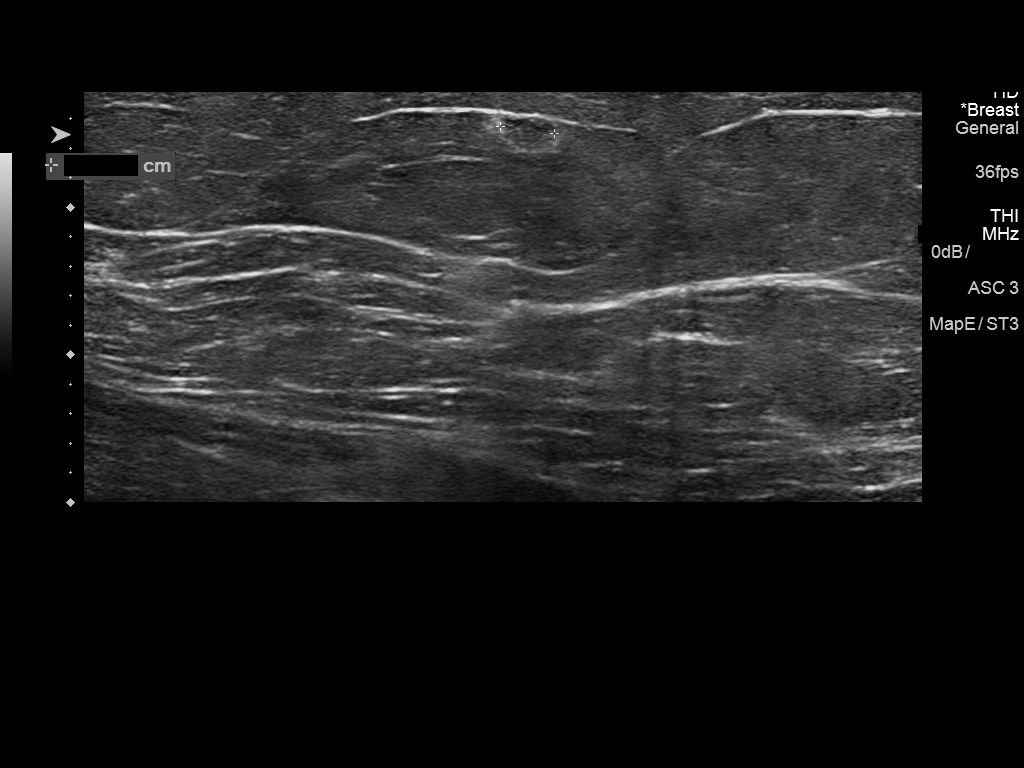
[im 5/7]
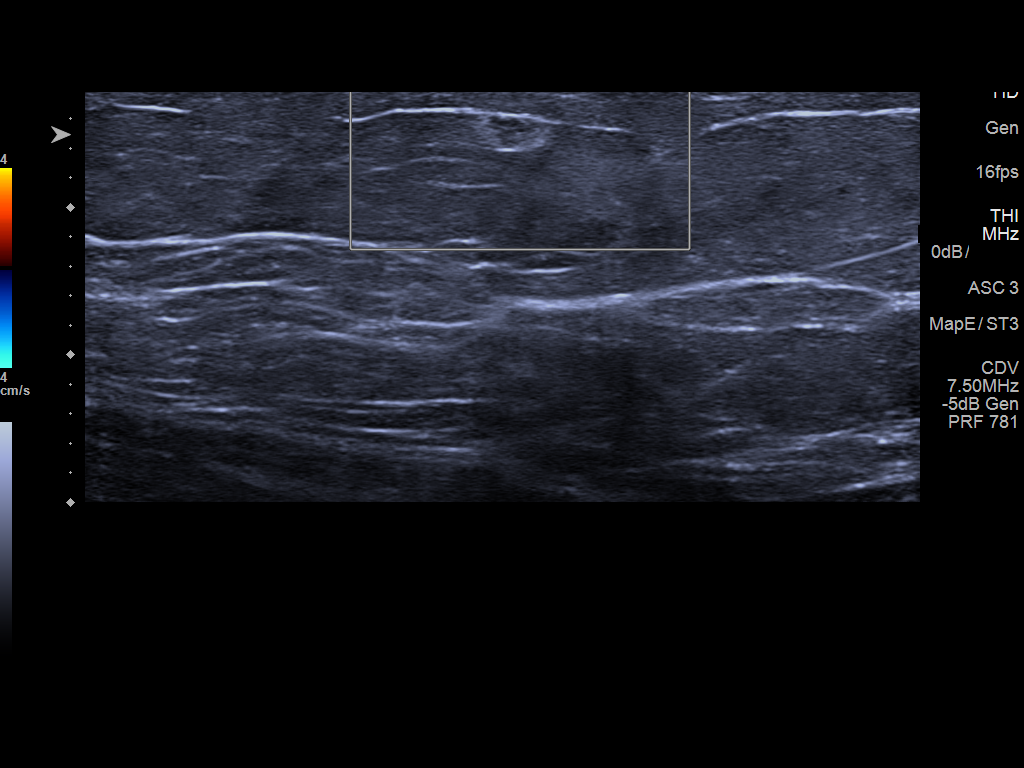
[im 6/7]
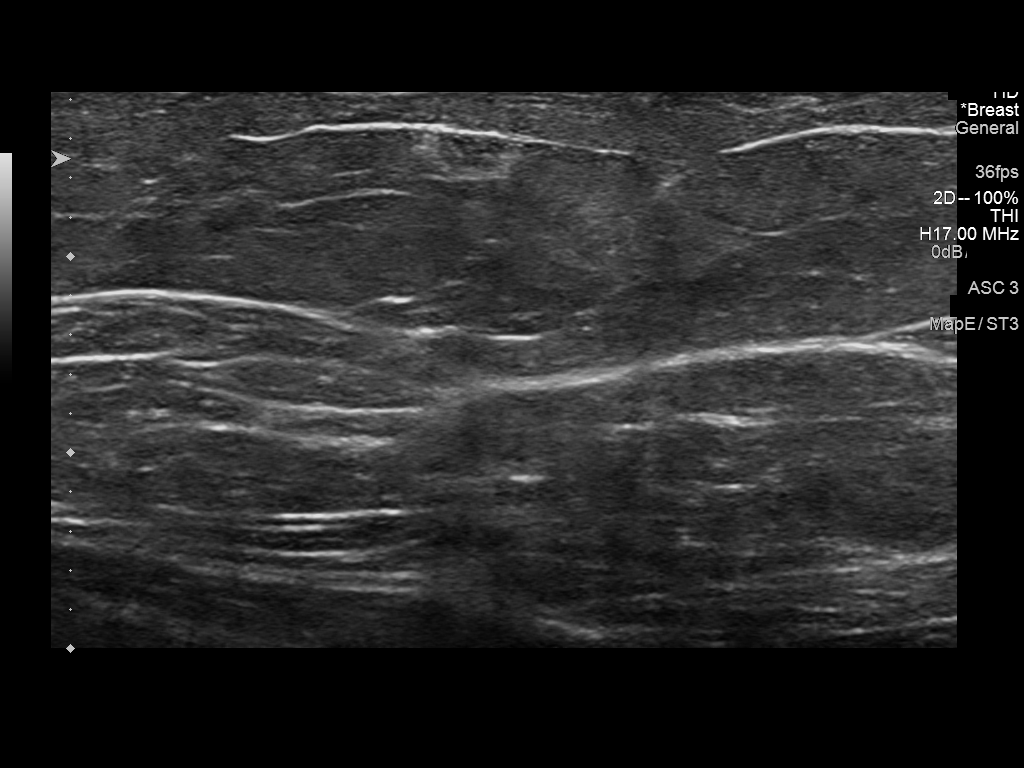
[im 7/7]
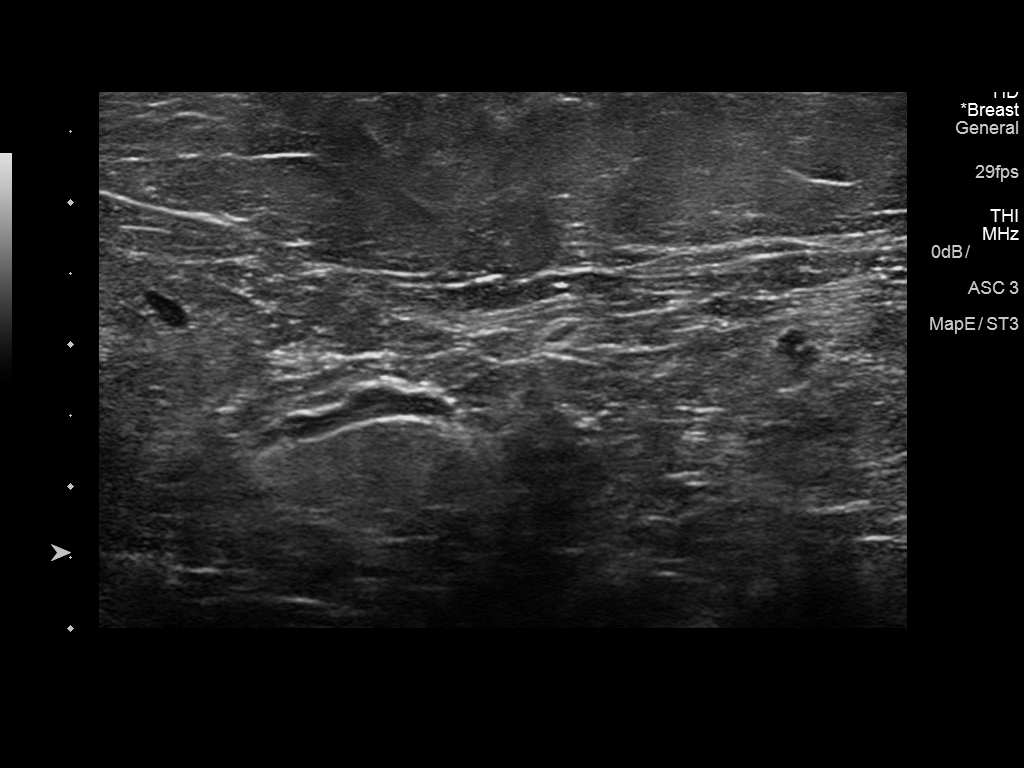

[7 of 7 positions shown; findings below may reference images not displayed]

ACR Breast Density Category b: There are scattered areas of
fibroglandular density.
FINDINGS: Spot compression MLO tomosynthesis as well as in L tomosynthesis was
performed of the left breast. There is an oval mass with slightly
irregular margins in the upper slightly inner left breast anterior
depth measuring approximately 4 mm which persists on today's
additional imaging.

Mammographic images were processed with CAD.

Physical examination of the upper left breast does not reveal any
palpable masses.

Targeted ultrasound of the left breast was performed demonstrating a
hypoechoic mass with slightly irregular margins at [DATE] 6 cm from
nipple superficial depth measuring 0.5 x 0.2 x 0.4 cm. This
corresponds well with mammography findings. No lymphadenopathy seen
in the left axilla.
IMPRESSION: Indeterminate left breast mass.

RECOMMENDATION:
Ultrasound-guided biopsy of the mass in the left breast at the [DATE]
position is recommended.

I have discussed the findings and recommendations with the patient.
Results were also provided in writing at the conclusion of the
visit. If applicable, a reminder letter will be sent to the patient
regarding the next appointment.

BI-RADS CATEGORY  Category 4A: Low suspicion for malignancy.

## 2018-03-29 DIAGNOSIS — E782 Mixed hyperlipidemia: Secondary | ICD-10-CM | POA: Diagnosis not present

## 2018-03-29 DIAGNOSIS — J4541 Moderate persistent asthma with (acute) exacerbation: Secondary | ICD-10-CM | POA: Diagnosis not present

## 2018-03-29 DIAGNOSIS — K210 Gastro-esophageal reflux disease with esophagitis: Secondary | ICD-10-CM | POA: Diagnosis not present

## 2018-04-06 DIAGNOSIS — K210 Gastro-esophageal reflux disease with esophagitis: Secondary | ICD-10-CM | POA: Diagnosis not present

## 2018-04-06 DIAGNOSIS — E782 Mixed hyperlipidemia: Secondary | ICD-10-CM | POA: Diagnosis not present

## 2018-04-06 DIAGNOSIS — J4541 Moderate persistent asthma with (acute) exacerbation: Secondary | ICD-10-CM | POA: Diagnosis not present

## 2018-04-06 DIAGNOSIS — Z0000 Encounter for general adult medical examination without abnormal findings: Secondary | ICD-10-CM | POA: Diagnosis not present

## 2018-04-06 DIAGNOSIS — Z1389 Encounter for screening for other disorder: Secondary | ICD-10-CM | POA: Diagnosis not present

## 2018-05-27 DIAGNOSIS — K210 Gastro-esophageal reflux disease with esophagitis: Secondary | ICD-10-CM | POA: Diagnosis not present

## 2018-05-27 DIAGNOSIS — J4541 Moderate persistent asthma with (acute) exacerbation: Secondary | ICD-10-CM | POA: Diagnosis not present

## 2018-05-27 DIAGNOSIS — E782 Mixed hyperlipidemia: Secondary | ICD-10-CM | POA: Diagnosis not present

## 2018-08-20 DIAGNOSIS — J4541 Moderate persistent asthma with (acute) exacerbation: Secondary | ICD-10-CM | POA: Diagnosis not present

## 2018-08-20 DIAGNOSIS — K210 Gastro-esophageal reflux disease with esophagitis: Secondary | ICD-10-CM | POA: Diagnosis not present

## 2018-08-20 DIAGNOSIS — E782 Mixed hyperlipidemia: Secondary | ICD-10-CM | POA: Diagnosis not present

## 2018-09-16 DIAGNOSIS — K210 Gastro-esophageal reflux disease with esophagitis: Secondary | ICD-10-CM | POA: Diagnosis not present

## 2018-09-16 DIAGNOSIS — E782 Mixed hyperlipidemia: Secondary | ICD-10-CM | POA: Diagnosis not present

## 2018-09-16 DIAGNOSIS — J4541 Moderate persistent asthma with (acute) exacerbation: Secondary | ICD-10-CM | POA: Diagnosis not present

## 2018-11-22 DIAGNOSIS — E782 Mixed hyperlipidemia: Secondary | ICD-10-CM | POA: Diagnosis not present

## 2018-11-22 DIAGNOSIS — K210 Gastro-esophageal reflux disease with esophagitis: Secondary | ICD-10-CM | POA: Diagnosis not present

## 2018-11-22 DIAGNOSIS — J4541 Moderate persistent asthma with (acute) exacerbation: Secondary | ICD-10-CM | POA: Diagnosis not present

## 2018-12-20 DIAGNOSIS — E782 Mixed hyperlipidemia: Secondary | ICD-10-CM | POA: Diagnosis not present

## 2018-12-20 DIAGNOSIS — J4541 Moderate persistent asthma with (acute) exacerbation: Secondary | ICD-10-CM | POA: Diagnosis not present

## 2019-01-07 DIAGNOSIS — E782 Mixed hyperlipidemia: Secondary | ICD-10-CM | POA: Diagnosis not present

## 2019-01-07 DIAGNOSIS — J4541 Moderate persistent asthma with (acute) exacerbation: Secondary | ICD-10-CM | POA: Diagnosis not present

## 2019-02-08 DIAGNOSIS — E782 Mixed hyperlipidemia: Secondary | ICD-10-CM | POA: Diagnosis not present

## 2019-02-08 DIAGNOSIS — J4541 Moderate persistent asthma with (acute) exacerbation: Secondary | ICD-10-CM | POA: Diagnosis not present

## 2019-07-26 DIAGNOSIS — K219 Gastro-esophageal reflux disease without esophagitis: Secondary | ICD-10-CM | POA: Diagnosis not present

## 2019-07-26 DIAGNOSIS — M25552 Pain in left hip: Secondary | ICD-10-CM | POA: Diagnosis not present

## 2019-07-26 DIAGNOSIS — R600 Localized edema: Secondary | ICD-10-CM | POA: Diagnosis not present

## 2019-07-26 DIAGNOSIS — J309 Allergic rhinitis, unspecified: Secondary | ICD-10-CM | POA: Diagnosis not present

## 2019-10-11 ENCOUNTER — Encounter: Payer: Self-pay | Admitting: Family

## 2019-12-27 ENCOUNTER — Encounter: Payer: Self-pay | Admitting: Internal Medicine

## 2020-01-23 ENCOUNTER — Telehealth: Payer: Self-pay

## 2020-01-23 NOTE — Telephone Encounter (Signed)
Call to pt re: previsit on 12/7 to check if pt is taking blood thinners and if so, pt will need an ov prior to colonoscopy sched on 12/27

## 2020-01-23 NOTE — Telephone Encounter (Signed)
Call to pt emergency contact [facility], he is going to try to reach pt and let her know to call us re: her appt next week.

## 2020-02-01 DIAGNOSIS — R059 Cough, unspecified: Secondary | ICD-10-CM | POA: Diagnosis not present

## 2020-02-01 DIAGNOSIS — J029 Acute pharyngitis, unspecified: Secondary | ICD-10-CM | POA: Diagnosis not present

## 2020-02-20 ENCOUNTER — Encounter: Payer: Medicare Other | Admitting: Internal Medicine

## 2020-03-09 ENCOUNTER — Encounter: Payer: Medicare Other | Admitting: Internal Medicine

## 2020-04-04 ENCOUNTER — Ambulatory Visit (AMBULATORY_SURGERY_CENTER): Payer: Self-pay

## 2020-04-04 ENCOUNTER — Other Ambulatory Visit: Payer: Self-pay

## 2020-04-04 VITALS — Ht 67.0 in | Wt 155.0 lb

## 2020-04-04 DIAGNOSIS — Z800 Family history of malignant neoplasm of digestive organs: Secondary | ICD-10-CM

## 2020-04-04 DIAGNOSIS — Z1211 Encounter for screening for malignant neoplasm of colon: Secondary | ICD-10-CM

## 2020-04-04 NOTE — Progress Notes (Signed)
No allergies to soy or egg Pt is not on blood thinners or diet pills Denies issues with sedation/intubation Denies atrial flutter/fib Denies constipation   Pt is aware of Covid safety and care partner requirements.   Pt is not on a blood thinner

## 2020-04-16 ENCOUNTER — Encounter: Payer: Self-pay | Admitting: Internal Medicine

## 2020-04-18 ENCOUNTER — Encounter: Payer: Medicare Other | Admitting: Internal Medicine

## 2020-04-18 ENCOUNTER — Telehealth: Payer: Self-pay | Admitting: *Deleted

## 2020-04-18 NOTE — Telephone Encounter (Signed)
Pt called this morning to cancel her procedure and stated that her blood sugar dropped, she got shaky and had to eat. She is not diabetic. Sounds like she's hypoglycemic but hasn't been diagnosed. She does not have a glucometer. I encouraged her to see her PCP this week and  get this evaluated. She has canceled her procedure multiple times due to this same reason. Will notify Dr. Carlean Purl. Pt will call back to reschedule after seeing her PCP.

## 2020-05-03 DIAGNOSIS — Z961 Presence of intraocular lens: Secondary | ICD-10-CM | POA: Diagnosis not present

## 2020-05-03 DIAGNOSIS — H26493 Other secondary cataract, bilateral: Secondary | ICD-10-CM | POA: Diagnosis not present

## 2020-05-03 DIAGNOSIS — H524 Presbyopia: Secondary | ICD-10-CM | POA: Diagnosis not present

## 2020-05-17 DIAGNOSIS — H26492 Other secondary cataract, left eye: Secondary | ICD-10-CM | POA: Diagnosis not present

## 2020-05-17 DIAGNOSIS — H26493 Other secondary cataract, bilateral: Secondary | ICD-10-CM | POA: Diagnosis not present

## 2020-05-17 DIAGNOSIS — H26491 Other secondary cataract, right eye: Secondary | ICD-10-CM | POA: Diagnosis not present

## 2021-12-12 ENCOUNTER — Encounter: Payer: Self-pay | Admitting: Physician Assistant

## 2021-12-12 ENCOUNTER — Other Ambulatory Visit: Payer: Self-pay | Admitting: Internal Medicine

## 2021-12-12 DIAGNOSIS — Z1231 Encounter for screening mammogram for malignant neoplasm of breast: Secondary | ICD-10-CM

## 2022-01-14 ENCOUNTER — Ambulatory Visit: Payer: Medicare Other | Admitting: Physician Assistant

## 2022-01-31 ENCOUNTER — Ambulatory Visit: Payer: Medicare Other

## 2022-02-26 ENCOUNTER — Ambulatory Visit: Payer: Medicare HMO | Admitting: Physician Assistant

## 2022-02-26 ENCOUNTER — Encounter: Payer: Self-pay | Admitting: Physician Assistant

## 2022-02-26 VITALS — BP 128/76 | HR 82 | Ht 66.0 in | Wt 154.2 lb

## 2022-02-26 DIAGNOSIS — Z800 Family history of malignant neoplasm of digestive organs: Secondary | ICD-10-CM | POA: Diagnosis not present

## 2022-02-26 DIAGNOSIS — Z86010 Personal history of colonic polyps: Secondary | ICD-10-CM | POA: Diagnosis not present

## 2022-02-26 MED ORDER — METOCLOPRAMIDE HCL 10 MG PO TABS: 10.0000 mg | ORAL_TABLET | ORAL | 0 refills | Status: AC

## 2022-02-26 MED ORDER — NA SULFATE-K SULFATE-MG SULF 17.5-3.13-1.6 GM/177ML PO SOLN: 1.0000 | Freq: Once | ORAL | 0 refills | Status: AC

## 2022-02-26 NOTE — Progress Notes (Signed)
Chief Complaint: Discuss colonoscopy  HPI:    [name] is a 67 year old female with past medical history as listed below including bipolar disorder and depression, as well as hepatitis B, who was referred to me by Neale Burly, MD for consideration of a colonoscopy.    04/18/2020 patient had been scheduled with Dr. Carlean Purl for colonoscopy but canceled her procedure due to low blood sugar.    Today, the patient presents to clinic and tells me that she wants to get her colonoscopy done.  She has had trouble in the past with the very end of the bowel prep because she would start vomiting and then her blood sugar would drop and she would not be able to finish it.  Describes that she has had a colonoscopy but it was years ago when she had colon polyps.  She also has a family history of colon cancer in her father and her brother.  Denies any GI complaints or concerns.    Denies fever, chills, weight loss, change in bowel habits, abdominal pain or blood in her stool.  Past Medical History:  Diagnosis Date   Abnormal mammogram of left breast 07/09/2015   Allergy    seasonal   Anxiety    Arthritis    Asthma    Bipolar disorder ([place])    on meds   Breast lump    Cataract    bilateral repaired.   Depression    no meds   Hepatitis    hep b   Hyperlipemia     Past Surgical History:  Procedure Laterality Date   BACK SURGERY     lumbar-  L2-L3    plates and screws   BREAST LUMPECTOMY WITH RADIOACTIVE SEED LOCALIZATION Left 07/09/2015   Procedure: LEFT BREAST LUMPECTOMY WITH RADIOACTIVE SEED LOCALIZATION;  Surgeon: Fanny Skates, MD;  Location: [place];  Service: General;  Laterality: Left;   CATARACT EXTRACTION W/PHACO Left 01/22/2015   Procedure: CATARACT EXTRACTION PHACO AND INTRAOCULAR LENS PLACEMENT ([place]);  Surgeon: Tonny Branch, MD;  Location: [facility];  Service: Ophthalmology;  Laterality: Left;  CDE: 4.36   CATARACT EXTRACTION W/PHACO Right 02/15/2015   Procedure:  CATARACT EXTRACTION PHACO AND INTRAOCULAR LENS PLACEMENT; CDE:  7.51;  Surgeon: Tonny Branch, MD;  Location: [facility];  Service: Ophthalmology;  Laterality: Right;   CHOLECYSTECTOMY     HIP FRACTURE SURGERY Right    JOINT REPLACEMENT     PILONIDAL CYST EXCISION     TONSILLECTOMY     TOTAL HIP ARTHROPLASTY Left 06/25/2016   Procedure: LEFT TOTAL HIP ARTHROPLASTY ANTERIOR APPROACH;  Surgeon: Gaynelle Arabian, MD;  Location: [facility];  Service: Orthopedics;  Laterality: Left;   TUBAL LIGATION      Current Outpatient Medications  Medication Sig Dispense Refill   amoxicillin (AMOXIL) 500 MG capsule Take 1,000 mg by mouth 2 (two) times daily. (Patient not taking: Reported on 04/04/2020)     baclofen (LIORESAL) 10 MG tablet Take 10 mg by mouth 2 (two) times daily.  (Patient not taking: Reported on 04/04/2020)     BREO ELLIPTA 100-25 MCG/INH AEPB Inhale 1 puff by mouth as needed for shortness of breath (Patient not taking: Reported on 04/04/2020)  3   ibuprofen (ADVIL) 800 MG tablet Take by mouth.     methocarbamol (ROBAXIN) 500 MG tablet Take 1 tablet (500 mg total) by mouth every 6 (six) hours as needed for muscle spasms. (Patient not taking: Reported on 04/04/2020) 80 tablet 0  montelukast (SINGULAIR) 10 MG tablet Take 10 mg by mouth at bedtime.      oxyCODONE (OXY IR/ROXICODONE) 5 MG immediate release tablet Take 1-2 tablets (5-10 mg total) by mouth every 4 (four) hours as needed for moderate pain or severe pain. (Patient not taking: Reported on 04/04/2020) 84 tablet 0   OxyCODONE ER 13.5 MG C12A Take 13.5 mg by mouth every 12 (twelve) hours.  (Patient not taking: Reported on 04/04/2020)     polyethylene glycol (MIRALAX / GLYCOLAX) packet Take 17 g by mouth daily as needed for mild constipation.  (Patient not taking: Reported on 04/04/2020)     PROAIR HFA 108 (90 Base) MCG/ACT inhaler inhale TWO puffs by MOUTH every FOUR TO SIX hours as needed for SHORTNESS OF BREATH  0   ranitidine (ZANTAC) 300 MG tablet Take 300 mg by  mouth daily as needed for heartburn.     traMADol (ULTRAM) 50 MG tablet Take 1-2 tablets (50-100 mg total) by mouth every 6 (six) hours as needed for moderate pain. (Patient not taking: Reported on 04/04/2020) 56 tablet 0   No current facility-administered medications for this visit.    Allergies as of 02/26/2022 - Review Complete 04/04/2020  Allergen Reaction Noted   Codeine  04/04/2020    Family History  Problem Relation Age of Onset   Heart disease Mother    Hypertension Father    Diabetes Father    Heart disease Father    Cancer Father        colon   Colon cancer Father    Cancer Brother        colon   Colon cancer Brother    Cancer Paternal Aunt        breast x2    Colon polyps Neg Hx    Esophageal cancer Neg Hx    Rectal cancer Neg Hx    Stomach cancer Neg Hx     Social History   Socioeconomic History   Marital status: Divorced    Spouse name: Not on file   Number of children: Not on file   Years of education: Not on file   Highest education level: Not on file  Occupational History   Not on file  Tobacco Use   Smoking status: Some Days    Types: Cigarettes    Last attempt to quit: 01/15/1995    Years since quitting: 27.1   Smokeless tobacco: Never  Vaping Use   Vaping Use: Never used  Substance and Sexual Activity   Alcohol use: No   Drug use: No   Sexual activity: Not Currently    Birth control/protection: Post-menopausal  Other Topics Concern   Not on file  Social History Narrative   Not on file   Social Determinants of Health   Financial Resource Strain: Not on file  Food Insecurity: Not on file  Transportation Needs: Not on file  Physical Activity: Not on file  Stress: Not on file  Social Connections: Not on file  Intimate Partner Violence: Not on file    Review of Systems:    Constitutional: No weight loss, fever or chills Skin: No rash  Cardiovascular: No chest pain Respiratory: No SOB  Gastrointestinal: See HPI and otherwise  negative Genitourinary: No dysuria  Neurological: No headache, dizziness or syncope Musculoskeletal: No new muscle or joint pain Hematologic: No bleeding  Psychiatric: No history of depression or anxiety   Physical Exam:  Vital signs: BP 128/76   Pulse 82   Ht 5'  6" (1.676 m)   Wt 154 lb 4 oz (70 kg)   LMP  (LMP Unknown)   BMI 24.90 kg/m    Constitutional:   Pleasant Caucasian female appears to be in NAD, Well developed, Well nourished, alert and cooperative Head:  Normocephalic and atraumatic. Eyes:   PEERL, EOMI. No icterus. Conjunctiva pink. Ears:  Normal auditory acuity. Neck:  Supple Throat: Oral cavity and pharynx without inflammation, swelling or lesion.  Respiratory: Respirations even and unlabored. Lungs clear to auscultation bilaterally.   No wheezes, crackles, or rhonchi.  Cardiovascular: Normal S1, S2. No MRG. Regular rate and rhythm. No peripheral edema, cyanosis or pallor.  Gastrointestinal:  Soft, nondistended, nontender. No rebound or guarding. Normal bowel sounds. No appreciable masses or hepatomegaly. Rectal:  Not performed.  Msk:  Symmetrical without gross deformities. Without edema, no deformity or joint abnormality.  Neurologic:  Alert and  oriented x4;  grossly normal neurologically.  Skin:   Dry and intact without significant lesions or rashes. Psychiatric: Demonstrates good judgement and reason without abnormal affect or behaviors.  No recent labs or imaging.  Assessment: 1.  Family history of colon cancer in her father and brother 2.  Personal history of colon polyps: On last documented colonoscopy in 2006 (we cannot see report but patient reports this)  Plan: 1.  Scheduled patient for surveillance colonoscopy in the [place] with Dr. Carlean Purl as this is who she was scheduled with previously.  Did provide the patient a detailed list of risks for the procedure and she agrees to proceed. 2.  In order to help with her prep problems we will schedule her for the  first appointment of the day, also do a smaller volume prep, sent in [place] today.  Also prescribed Reglan 10 mg, 2 tabs.  1 tab to be taken 20-30 minutes before the first half of prep and the other to be taken before the second half. 3.  Patient to follow in clinic per recommendations from Dr. Carlean Purl after time of procedure.  Ellouise Newer, PA-C [facility] 02/26/2022, 2:58 PM  Cc: Neale Burly, MD

## 2022-02-26 NOTE — Patient Instructions (Addendum)
_______________________________________________________  If you are age 66 or older, your body mass index should be between 23-30. Your Body mass index is 24.9 kg/m. If this is out of the aforementioned range listed, please consider follow up with your Primary Care Provider.  If you are age 61 or younger, your body mass index should be between 19-25. Your Body mass index is 24.9 kg/m. If this is out of the aformentioned range listed, please consider follow up with your Primary Care Provider.   ________________________________________________________  The [facility] providers would like to encourage you to use [facility] to communicate with providers for non-urgent requests or questions.  Due to long hold times on the telephone, sending your provider a message by [facility] may be a faster and more efficient way to get a response.  Please allow 48 business hours for a response.  Please remember that this is for non-urgent requests.  _______________________________________________________  We have sent the following medications to your pharmacy for you to pick up at your convenience: Suprep Reglan take 1 tablet 30 minutes before 1st and 2nd half of prep  You have been scheduled for a colonoscopy. Please follow written instructions given to you at your visit today.  Please pick up your prep supplies at the pharmacy within the next 1-3 days. If you use inhalers (even only as needed), please bring them with you on the day of your procedure.

## 2022-03-31 ENCOUNTER — Ambulatory Visit: Payer: Medicare Other

## 2022-04-03 ENCOUNTER — Encounter: Payer: Self-pay | Admitting: Internal Medicine

## 2022-04-10 ENCOUNTER — Ambulatory Visit (AMBULATORY_SURGERY_CENTER): Payer: Medicare HMO | Admitting: Internal Medicine

## 2022-04-10 ENCOUNTER — Encounter: Payer: Self-pay | Admitting: Internal Medicine

## 2022-04-10 VITALS — BP 112/68 | HR 79 | Temp 96.6°F | Resp 16 | Ht 66.0 in | Wt 154.0 lb

## 2022-04-10 DIAGNOSIS — D128 Benign neoplasm of rectum: Secondary | ICD-10-CM | POA: Diagnosis not present

## 2022-04-10 DIAGNOSIS — Z09 Encounter for follow-up examination after completed treatment for conditions other than malignant neoplasm: Secondary | ICD-10-CM | POA: Diagnosis not present

## 2022-04-10 DIAGNOSIS — D124 Benign neoplasm of descending colon: Secondary | ICD-10-CM

## 2022-04-10 DIAGNOSIS — K621 Rectal polyp: Secondary | ICD-10-CM

## 2022-04-10 DIAGNOSIS — D123 Benign neoplasm of transverse colon: Secondary | ICD-10-CM

## 2022-04-10 DIAGNOSIS — Z86010 Personal history of colonic polyps: Secondary | ICD-10-CM | POA: Diagnosis not present

## 2022-04-10 DIAGNOSIS — Z800 Family history of malignant neoplasm of digestive organs: Secondary | ICD-10-CM

## 2022-04-10 DIAGNOSIS — K629 Disease of anus and rectum, unspecified: Secondary | ICD-10-CM

## 2022-04-10 DIAGNOSIS — K5140 Inflammatory polyps of colon without complications: Secondary | ICD-10-CM

## 2022-04-10 MED ORDER — SODIUM CHLORIDE 0.9 % IV SOLN: 500.0000 mL | INTRAVENOUS | Status: DC

## 2022-04-10 NOTE — Progress Notes (Signed)
Report to pacu rn. Vss. Care resumed by rn. 

## 2022-04-10 NOTE — Progress Notes (Signed)
[facility] History and Physical   Primary Care Physician:  System, Provider Not In   Reason for Procedure:   Hx colon polyps and FHx CRCA  Plan:    colonoscopy     HPI: [name] is a 67 y.o. female with past medical history as listed below including bipolar disorder and depression, as well as hepatitis B, who was referred for consideration of a colonoscopy.    04/18/2020 patient had been scheduled with Dr. Carlean Purl for colonoscopy but canceled her procedure due to low blood sugar.      She has had trouble in the past with the very end of the bowel prep because she would start vomiting and then her blood sugar would drop and she would not be able to finish it.  Describes that she has had a colonoscopy but it was years ago when she had colon polyps.  She also has a family history of colon cancer in her father and her brother.  Denies any GI complaints or concerns.    Denies fever, chills, weight loss, change in bowel habits, abdominal pain or blood in her stool.   Past Medical History:  Diagnosis Date   Abnormal mammogram of left breast 07/09/2015   Alcoholism ([place])    Allergy    seasonal   Anxiety    Arthritis    Asthma    Bipolar disorder ([place])    on meds   Breast lump    Cataract    bilateral repaired.   Depression    no meds   Hepatitis    hep b   History of colon polyps    Hyperlipemia     Past Surgical History:  Procedure Laterality Date   BACK SURGERY     lumbar-  L2-L3    plates and screws   BREAST LUMPECTOMY WITH RADIOACTIVE SEED LOCALIZATION Left 07/09/2015   Procedure: LEFT BREAST LUMPECTOMY WITH RADIOACTIVE SEED LOCALIZATION;  Surgeon: Fanny Skates, MD;  Location: [facility];  Service: General;  Laterality: Left;   CATARACT EXTRACTION W/PHACO Left 01/22/2015   Procedure: CATARACT EXTRACTION PHACO AND INTRAOCULAR LENS PLACEMENT ([place]);  Surgeon: Tonny Branch, MD;  [facility];  Service: Ophthalmology;  Laterality: Left;   CDE: 4.36   CATARACT EXTRACTION W/PHACO Right 02/15/2015   Procedure: CATARACT EXTRACTION PHACO AND INTRAOCULAR LENS PLACEMENT; CDE:  7.51;  Surgeon: Tonny Branch, MD;  Location: [facility];  Service: Ophthalmology;  Laterality: Right;   CHOLECYSTECTOMY     HIP FRACTURE SURGERY Right    JOINT REPLACEMENT     PILONIDAL CYST EXCISION     TONSILLECTOMY     TOTAL HIP ARTHROPLASTY Left 06/25/2016   Procedure: LEFT TOTAL HIP ARTHROPLASTY ANTERIOR APPROACH;  Surgeon: Gaynelle Arabian, MD;  [facility];  Service: Orthopedics;  Laterality: Left;   TUBAL LIGATION      Prior to Admission medications   Medication Sig Start Date End Date Taking? Authorizing Provider  metoCLOPramide (REGLAN) 10 MG tablet Take 1 tablet (10 mg total) by mouth as directed. Take 1 tablet 30 minutes before first half of prep and take 1 tablet 30 minutes before second half of prep 02/26/22  Yes Levin Erp, PA  omeprazole (PRILOSEC) 40 MG capsule Take 40 mg by mouth as needed.   Yes [provider]  montelukast (SINGULAIR) 10 MG tablet Take 10 mg by mouth at bedtime.     [provider]    Current Outpatient Medications  Medication Sig Dispense Refill  metoCLOPramide (REGLAN) 10 MG tablet Take 1 tablet (10 mg total) by mouth as directed. Take 1 tablet 30 minutes before first half of prep and take 1 tablet 30 minutes before second half of prep 2 tablet 0   omeprazole (PRILOSEC) 40 MG capsule Take 40 mg by mouth as needed.     montelukast (SINGULAIR) 10 MG tablet Take 10 mg by mouth at bedtime.      Current Facility-Administered Medications  Medication Dose Route Frequency Provider Last Rate Last Admin   0.9 %  sodium chloride infusion  500 mL Intravenous Continuous Gatha Mayer, MD        Allergies as of 04/10/2022 - Review Complete 04/10/2022  Allergen Reaction Noted   Codeine  04/04/2020    Family History  Problem Relation Age of Onset   Heart disease Mother    Hypertension Father     Diabetes Father    Heart disease Father    Cancer Father        colon   Colon cancer Father    Cancer Brother        colon   Colon cancer Brother    Cancer Paternal Aunt        breast x2    Colon polyps Neg Hx    Esophageal cancer Neg Hx    Rectal cancer Neg Hx    Stomach cancer Neg Hx     Social History   Socioeconomic History   Marital status: Divorced    Spouse name: Not on file   Number of children: Not on file   Years of education: Not on file   Highest education level: Not on file  Occupational History   Not on file  Tobacco Use   Smoking status: Every Day    Packs/day: 0.50    Types: Cigarettes    Last attempt to quit: 01/15/1995    Years since quitting: 27.2   Smokeless tobacco: Never  Vaping Use   Vaping Use: Never used  Substance and Sexual Activity   Alcohol use: No   Drug use: No   Sexual activity: Not Currently    Birth control/protection: Post-menopausal  Other Topics Concern   Not on file  Social History Narrative   Not on file   Social Determinants of Health   Financial Resource Strain: Not on file  Food Insecurity: Not on file  Transportation Needs: Not on file  Physical Activity: Not on file  Stress: Not on file  Social Connections: Not on file  Intimate Partner Violence: Not on file    Review of Systems:  All other review of systems negative except as mentioned in the HPI.  Physical Exam: Vital signs BP 127/71   Pulse 95   Temp (!) 96.6 F (35.9 C)   Ht 5' 6"$  (1.676 m)   Wt 154 lb (69.9 kg)   LMP  (LMP Unknown)   SpO2 98%   BMI 24.86 kg/m   General:   Alert,  Well-developed, well-nourished, pleasant and cooperative in NAD Lungs:  Clear throughout to auscultation.   Heart:  Regular rate and rhythm; no murmurs, clicks, rubs,  or gallops. Abdomen:  Soft, nontender and nondistended. Normal bowel sounds.   Neuro/Psych:  Alert and cooperative. Normal mood and affect. A and O x 3   @Kimberleigh Mehan$  Simonne Maffucci, MD, [facility]  Gastroenterology [phone] (pager) 04/10/2022 9:11 AM@

## 2022-04-10 NOTE — Progress Notes (Signed)
Called to room to assist during endoscopic procedure.  Patient ID and intended procedure confirmed with present staff. Received instructions for my participation in the procedure from the performing physician.  

## 2022-04-10 NOTE — Patient Instructions (Addendum)
There is a lesion in the rectum that I biopsied but did not remove. I think it will require removal with surgery via the anus but need to see the biopsies. It did not look like a cancer but that is possible. It is small. I should have results by next week.  Two small polyps also removed - they look benign but pre-cancerous.  You also have a condition called diverticulosis - common and not usually a problem. Please read the handout provided.  Hemorrhoids also seen.  I appreciate the opportunity to care for you. Gatha Mayer, MD, [facility]  Resume all of your previous medications.  Read all of the handouts given to you by your recovery room nurse.  YOU HAD AN ENDOSCOPIC PROCEDURE TODAY AT [facility]:   Refer to the procedure report that was given to you for any specific questions about what was found during the examination.  If the procedure report does not answer your questions, please call your gastroenterologist to clarify.  If you requested that your care partner not be given the details of your procedure findings, then the procedure report has been included in a sealed envelope for you to review at your convenience later.  YOU SHOULD EXPECT: Some feelings of bloating in the abdomen. Passage of more gas than usual.  Walking can help get rid of the air that was put into your GI tract during the procedure and reduce the bloating. If you had a lower endoscopy (such as a colonoscopy or flexible sigmoidoscopy) you may notice spotting of blood in your stool or on the toilet paper. If you underwent a bowel prep for your procedure, you may not have a normal bowel movement for a few days.  Please Note:  You might notice some irritation and congestion in your nose or some drainage.  This is from the oxygen used during your procedure.  There is no need for concern and it should clear up in a day or so.  SYMPTOMS TO REPORT IMMEDIATELY:  Following lower endoscopy (colonoscopy or flexible  sigmoidoscopy):  Excessive amounts of blood in the stool  Significant tenderness or worsening of abdominal pains  Swelling of the abdomen that is new, acute  Fever of 100F   For urgent or emergent issues, a gastroenterologist can be reached at any hour by calling [phone]. Do not use MyChart messaging for urgent concerns.    DIET:  We do recommend a small meal at first, but then you may proceed to your regular diet.  Drink plenty of fluids but you should avoid alcoholic beverages for 24 hours.  ACTIVITY:  You should plan to take it easy for the rest of today and you should NOT DRIVE or use heavy machinery until tomorrow (because of the sedation medicines used during the test).    FOLLOW UP: Our staff will call the number listed on your records the next business day following your procedure.  We will call around 7:15- 8:00 am to check on you and address any questions or concerns that you may have regarding the information given to you following your procedure. If we do not reach you, we will leave a message.     If any biopsies were taken you will be contacted by phone or by letter within the next 1-3 weeks.  Please call us at [phone] if you have not heard about the biopsies in 3 weeks.    SIGNATURES/CONFIDENTIALITY: You and/or your care partner have signed paperwork  which will be entered into your electronic medical record.  These signatures attest to the fact that that the information above on your After Visit Summary has been reviewed and is understood.  Full responsibility of the confidentiality of this discharge information lies with you and/or your care-partner.

## 2022-04-10 NOTE — Op Note (Signed)
[facility] Patient Name: [name] Procedure Date: 04/10/2022 9:09 AM MRN: [id number] Endoscopist: Gatha Mayer , MD, [id number] Age: 67 Referring MD:  Date of Birth: [birth year] Gender: Female Account #: [id number] Procedure:                Colonoscopy Indications:              Surveillance: Personal history of colonic polyps                            (unknown histology) on last colonoscopy more than 5                            years ago + family hx CRCA Medicines:                Monitored Anesthesia Care Procedure:                Pre-Anesthesia Assessment:                           - Prior to the procedure, a History and Physical                            was performed, and patient medications and                            allergies were reviewed. The patient's tolerance of                            previous anesthesia was also reviewed. The risks                            and benefits of the procedure and the sedation                            options and risks were discussed with the patient.                            All questions were answered, and informed consent                            was obtained. Prior Anticoagulants: The patient has                            taken no anticoagulant or antiplatelet agents. ASA                            Grade Assessment: II - A patient with mild systemic                            disease. After reviewing the risks and benefits,                            the patient was deemed in satisfactory condition to  undergo the procedure.                           After obtaining informed consent, the colonoscope                            was passed under direct vision. Throughout the                            procedure, the patient's blood pressure, pulse, and                            oxygen saturations were monitored continuously. The                            PCF-HQ190L Colonoscope  [id number] was introduced                            through the anus and advanced to the the cecum,                            identified by appendiceal orifice and ileocecal                            valve. The colonoscopy was performed without                            difficulty. The patient tolerated the procedure                            well. The quality of the bowel preparation was                            adequate. The ileocecal valve, appendiceal orifice,                            and rectum were photographed. The bowel preparation                            used was SUPREP via split dose instruction. Scope In: 9:25:27 AM Scope Out: 9:47:24 AM Scope Withdrawal Time: 0 hours 19 minutes 29 seconds  Total Procedure Duration: 0 hours 21 minutes 57 seconds  Findings:                 The perianal and digital rectal examinations were                            normal.                           A 15 to 20 mm polyp was found in the distal rectum.                            The polyp was sessile/flat. Biopsies were taken  with a cold forceps for histology. Verification of                            patient identification for the specimen was done.                            Estimated blood loss was minimal.                           Two sessile and semi-pedunculated polyps were found                            in the proximal descending colon and distal                            transverse colon. The polyps were 3 to 7 mm in                            size. These polyps were removed with a cold snare.                            Resection and retrieval were complete. Verification                            of patient identification for the specimen was                            done. Estimated blood loss was minimal.                           Multiple diverticula were found in the sigmoid                            colon.                           External  and internal hemorrhoids were found.                           The exam was otherwise without abnormality on                            direct and retroflexion views. Complications:            No immediate complications. Estimated Blood Loss:     Estimated blood loss was minimal. Impression:               - One 15 to 20 mm polyp in the distal rectum.                            Biopsied. Firm, similar color to mucosa - initially                            approached with snare but firmness precluded. No  palpable - looks like it is in right anterior area.                            I think this will need transanal excison depending                            upon path.                           - Two 3 to 7 mm polyps in the proximal descending                            colon and in the distal transverse colon, removed                            with a cold snare. Resected and retrieved.                           - Diverticulosis in the sigmoid colon.                           - External and internal hemorrhoids.                           - The examination was otherwise normal on direct                            and retroflexion views.                           - Personal history of colonic polyps remote exam                            and Family hisrory CRCA father > 98 and brother <60. Recommendation:           - Patient has a contact number available for                            emergencies. The signs and symptoms of potential                            delayed complications were discussed with the                            patient. Return to normal activities tomorrow.                            Written discharge instructions were provided to the                            patient.                           - Resume previous diet.                           -  Continue present medications.                           - Await pathology results.                            - Repeat colonoscopy is recommended. The                            colonoscopy date will be determined after pathology                            results from today's exam become available for                            review. Gatha Mayer, MD 04/10/2022 10:00:57 AM This report has been signed electronically.

## 2022-04-10 NOTE — Progress Notes (Signed)
Pt's states no medical or surgical changes since previsit or office visit. 

## 2022-04-11 ENCOUNTER — Telehealth: Payer: Self-pay | Admitting: *Deleted

## 2022-04-11 NOTE — Telephone Encounter (Signed)
Post procedure follow up phone call. No answer at number given.

## 2023-06-01 ENCOUNTER — Telehealth: Payer: Self-pay | Admitting: Internal Medicine

## 2023-06-01 NOTE — Telephone Encounter (Signed)
 Please see path result notes from last year - she had a large rectal polyp and was referred to [facility] (Dr. Maisie Fus)   Was to see her in April 2024  I do not think that has happened  Need to contact the patient and investigate and she still needs to see colorectal surgeon

## 2023-06-01 NOTE — Telephone Encounter (Signed)
 Left message with family member to call back  Also attempted to reach the pt at her home  number- no answer voice mail full

## 2023-06-02 NOTE — Telephone Encounter (Signed)
 Spoke with family member and left message to have pt return call as able.

## 2023-06-02 NOTE — Telephone Encounter (Signed)
 I spoke with the pt and she tells me that she did not go to the surgical office visit. She is not interested in having an appt at this time. She tells me that if she changes her mind she will call back.

## 2023-06-02 NOTE — Telephone Encounter (Signed)
 Inbound call from patient returning phone call. Please advise, thank you.

## 2023-09-28 ENCOUNTER — Encounter: Payer: Self-pay | Admitting: Internal Medicine
